# Patient Record
Sex: Female | Born: 1955 | Race: White | Hispanic: No
Health system: Southern US, Community
[De-identification: ages and names within clinical notes are randomized; demographics above are authoritative.]

## PROBLEM LIST (undated history)

## (undated) DIAGNOSIS — F419 Anxiety disorder, unspecified: Secondary | ICD-10-CM

## (undated) DIAGNOSIS — F329 Major depressive disorder, single episode, unspecified: Secondary | ICD-10-CM

## (undated) DIAGNOSIS — G8929 Other chronic pain: Secondary | ICD-10-CM

## (undated) DIAGNOSIS — E669 Obesity, unspecified: Secondary | ICD-10-CM

## (undated) DIAGNOSIS — G40909 Epilepsy, unspecified, not intractable, without status epilepticus: Secondary | ICD-10-CM

## (undated) DIAGNOSIS — I639 Cerebral infarction, unspecified: Secondary | ICD-10-CM

## (undated) DIAGNOSIS — K760 Fatty (change of) liver, not elsewhere classified: Secondary | ICD-10-CM

## (undated) DIAGNOSIS — N189 Chronic kidney disease, unspecified: Secondary | ICD-10-CM

## (undated) DIAGNOSIS — A0472 Enterocolitis due to Clostridium difficile, not specified as recurrent: Secondary | ICD-10-CM

## (undated) DIAGNOSIS — F32A Depression, unspecified: Secondary | ICD-10-CM

## (undated) DIAGNOSIS — J449 Chronic obstructive pulmonary disease, unspecified: Secondary | ICD-10-CM

## (undated) DIAGNOSIS — D126 Benign neoplasm of colon, unspecified: Secondary | ICD-10-CM

## (undated) DIAGNOSIS — E1165 Type 2 diabetes mellitus with hyperglycemia: Secondary | ICD-10-CM

---
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
## 2018-03-13 DIAGNOSIS — R531 Weakness: Secondary | ICD-10-CM | POA: Diagnosis not present

## 2018-03-13 DIAGNOSIS — N289 Disorder of kidney and ureter, unspecified: Secondary | ICD-10-CM | POA: Diagnosis not present

## 2018-03-13 DIAGNOSIS — R262 Difficulty in walking, not elsewhere classified: Secondary | ICD-10-CM | POA: Diagnosis not present

## 2018-03-13 DIAGNOSIS — R197 Diarrhea, unspecified: Secondary | ICD-10-CM | POA: Diagnosis not present

## 2018-03-13 DIAGNOSIS — Z9181 History of falling: Secondary | ICD-10-CM | POA: Diagnosis not present

## 2018-03-13 DIAGNOSIS — E860 Dehydration: Secondary | ICD-10-CM | POA: Diagnosis not present

## 2018-03-14 DIAGNOSIS — R262 Difficulty in walking, not elsewhere classified: Secondary | ICD-10-CM | POA: Diagnosis not present

## 2018-03-14 DIAGNOSIS — N289 Disorder of kidney and ureter, unspecified: Secondary | ICD-10-CM | POA: Diagnosis not present

## 2018-03-14 DIAGNOSIS — Z9181 History of falling: Secondary | ICD-10-CM | POA: Diagnosis not present

## 2018-03-14 DIAGNOSIS — R531 Weakness: Secondary | ICD-10-CM | POA: Diagnosis not present

## 2018-04-01 DIAGNOSIS — I10 Essential (primary) hypertension: Secondary | ICD-10-CM | POA: Diagnosis not present

## 2018-04-01 DIAGNOSIS — R0789 Other chest pain: Secondary | ICD-10-CM | POA: Diagnosis not present

## 2018-04-01 DIAGNOSIS — W19XXXA Unspecified fall, initial encounter: Secondary | ICD-10-CM | POA: Diagnosis not present

## 2018-04-01 DIAGNOSIS — N189 Chronic kidney disease, unspecified: Secondary | ICD-10-CM | POA: Diagnosis not present

## 2018-04-01 DIAGNOSIS — E1165 Type 2 diabetes mellitus with hyperglycemia: Secondary | ICD-10-CM | POA: Diagnosis not present

## 2018-04-01 DIAGNOSIS — E875 Hyperkalemia: Secondary | ICD-10-CM | POA: Diagnosis not present

## 2018-05-04 DIAGNOSIS — R4182 Altered mental status, unspecified: Secondary | ICD-10-CM

## 2018-05-04 DIAGNOSIS — I10 Essential (primary) hypertension: Secondary | ICD-10-CM

## 2018-05-04 DIAGNOSIS — N189 Chronic kidney disease, unspecified: Secondary | ICD-10-CM | POA: Diagnosis not present

## 2018-05-04 DIAGNOSIS — R531 Weakness: Secondary | ICD-10-CM

## 2018-05-04 DIAGNOSIS — R4781 Slurred speech: Secondary | ICD-10-CM | POA: Diagnosis not present

## 2018-05-04 DIAGNOSIS — E1165 Type 2 diabetes mellitus with hyperglycemia: Secondary | ICD-10-CM | POA: Diagnosis not present

## 2018-05-05 DIAGNOSIS — I517 Cardiomegaly: Secondary | ICD-10-CM

## 2018-05-05 DIAGNOSIS — N930 Postcoital and contact bleeding: Secondary | ICD-10-CM

## 2018-05-05 DIAGNOSIS — N189 Chronic kidney disease, unspecified: Secondary | ICD-10-CM | POA: Diagnosis not present

## 2018-05-05 DIAGNOSIS — E1165 Type 2 diabetes mellitus with hyperglycemia: Secondary | ICD-10-CM | POA: Diagnosis not present

## 2018-05-05 DIAGNOSIS — R4781 Slurred speech: Secondary | ICD-10-CM | POA: Diagnosis not present

## 2018-05-05 DIAGNOSIS — R4182 Altered mental status, unspecified: Secondary | ICD-10-CM | POA: Diagnosis not present

## 2018-05-06 DIAGNOSIS — R4182 Altered mental status, unspecified: Secondary | ICD-10-CM | POA: Diagnosis not present

## 2018-05-06 DIAGNOSIS — N189 Chronic kidney disease, unspecified: Secondary | ICD-10-CM | POA: Diagnosis not present

## 2018-05-06 DIAGNOSIS — R4781 Slurred speech: Secondary | ICD-10-CM | POA: Diagnosis not present

## 2018-05-06 DIAGNOSIS — E1165 Type 2 diabetes mellitus with hyperglycemia: Secondary | ICD-10-CM | POA: Diagnosis not present

## 2018-05-07 DIAGNOSIS — N189 Chronic kidney disease, unspecified: Secondary | ICD-10-CM | POA: Diagnosis not present

## 2018-05-07 DIAGNOSIS — E1165 Type 2 diabetes mellitus with hyperglycemia: Secondary | ICD-10-CM | POA: Diagnosis not present

## 2018-05-07 DIAGNOSIS — R4781 Slurred speech: Secondary | ICD-10-CM | POA: Diagnosis not present

## 2018-05-07 DIAGNOSIS — R4182 Altered mental status, unspecified: Secondary | ICD-10-CM | POA: Diagnosis not present

## 2018-05-08 DIAGNOSIS — R4781 Slurred speech: Secondary | ICD-10-CM | POA: Diagnosis not present

## 2018-05-08 DIAGNOSIS — N189 Chronic kidney disease, unspecified: Secondary | ICD-10-CM | POA: Diagnosis not present

## 2018-05-08 DIAGNOSIS — E1165 Type 2 diabetes mellitus with hyperglycemia: Secondary | ICD-10-CM | POA: Diagnosis not present

## 2018-05-08 DIAGNOSIS — R4182 Altered mental status, unspecified: Secondary | ICD-10-CM | POA: Diagnosis not present

## 2018-05-09 DIAGNOSIS — N189 Chronic kidney disease, unspecified: Secondary | ICD-10-CM | POA: Diagnosis not present

## 2018-05-09 DIAGNOSIS — E1165 Type 2 diabetes mellitus with hyperglycemia: Secondary | ICD-10-CM | POA: Diagnosis not present

## 2018-05-09 DIAGNOSIS — R4781 Slurred speech: Secondary | ICD-10-CM | POA: Diagnosis not present

## 2018-05-09 DIAGNOSIS — R4182 Altered mental status, unspecified: Secondary | ICD-10-CM | POA: Diagnosis not present

## 2018-05-10 DIAGNOSIS — R4182 Altered mental status, unspecified: Secondary | ICD-10-CM | POA: Diagnosis not present

## 2018-05-10 DIAGNOSIS — E1165 Type 2 diabetes mellitus with hyperglycemia: Secondary | ICD-10-CM | POA: Diagnosis not present

## 2018-05-10 DIAGNOSIS — R4781 Slurred speech: Secondary | ICD-10-CM | POA: Diagnosis not present

## 2018-05-10 DIAGNOSIS — N189 Chronic kidney disease, unspecified: Secondary | ICD-10-CM | POA: Diagnosis not present

## 2018-05-11 DIAGNOSIS — R4781 Slurred speech: Secondary | ICD-10-CM | POA: Diagnosis not present

## 2018-05-11 DIAGNOSIS — N179 Acute kidney failure, unspecified: Secondary | ICD-10-CM

## 2018-05-11 DIAGNOSIS — M21331 Wrist drop, right wrist: Secondary | ICD-10-CM

## 2018-05-11 DIAGNOSIS — R262 Difficulty in walking, not elsewhere classified: Secondary | ICD-10-CM

## 2018-05-11 DIAGNOSIS — E860 Dehydration: Secondary | ICD-10-CM

## 2018-05-11 DIAGNOSIS — N390 Urinary tract infection, site not specified: Secondary | ICD-10-CM

## 2018-05-11 DIAGNOSIS — E875 Hyperkalemia: Secondary | ICD-10-CM

## 2018-05-11 DIAGNOSIS — E1165 Type 2 diabetes mellitus with hyperglycemia: Secondary | ICD-10-CM | POA: Diagnosis not present

## 2018-05-11 DIAGNOSIS — R4182 Altered mental status, unspecified: Secondary | ICD-10-CM | POA: Diagnosis not present

## 2018-05-11 DIAGNOSIS — F0390 Unspecified dementia without behavioral disturbance: Secondary | ICD-10-CM

## 2018-05-11 DIAGNOSIS — G5630 Lesion of radial nerve, unspecified upper limb: Secondary | ICD-10-CM

## 2018-05-11 DIAGNOSIS — N189 Chronic kidney disease, unspecified: Secondary | ICD-10-CM | POA: Diagnosis not present

## 2018-05-11 DIAGNOSIS — S9032XA Contusion of left foot, initial encounter: Secondary | ICD-10-CM

## 2018-05-11 DIAGNOSIS — W19XXXA Unspecified fall, initial encounter: Secondary | ICD-10-CM

## 2018-05-11 DIAGNOSIS — R109 Unspecified abdominal pain: Secondary | ICD-10-CM

## 2018-05-12 DIAGNOSIS — N189 Chronic kidney disease, unspecified: Secondary | ICD-10-CM | POA: Diagnosis not present

## 2018-05-12 DIAGNOSIS — R4182 Altered mental status, unspecified: Secondary | ICD-10-CM | POA: Diagnosis not present

## 2018-05-12 DIAGNOSIS — R4781 Slurred speech: Secondary | ICD-10-CM | POA: Diagnosis not present

## 2018-05-12 DIAGNOSIS — E1165 Type 2 diabetes mellitus with hyperglycemia: Secondary | ICD-10-CM | POA: Diagnosis not present

## 2018-05-13 DIAGNOSIS — R4182 Altered mental status, unspecified: Secondary | ICD-10-CM | POA: Diagnosis not present

## 2018-05-13 DIAGNOSIS — N189 Chronic kidney disease, unspecified: Secondary | ICD-10-CM | POA: Diagnosis not present

## 2018-05-13 DIAGNOSIS — E1165 Type 2 diabetes mellitus with hyperglycemia: Secondary | ICD-10-CM | POA: Diagnosis not present

## 2018-05-13 DIAGNOSIS — R4781 Slurred speech: Secondary | ICD-10-CM | POA: Diagnosis not present

## 2018-05-14 DIAGNOSIS — R4182 Altered mental status, unspecified: Secondary | ICD-10-CM | POA: Diagnosis not present

## 2018-05-14 DIAGNOSIS — N189 Chronic kidney disease, unspecified: Secondary | ICD-10-CM | POA: Diagnosis not present

## 2018-05-14 DIAGNOSIS — E1165 Type 2 diabetes mellitus with hyperglycemia: Secondary | ICD-10-CM | POA: Diagnosis not present

## 2018-05-14 DIAGNOSIS — R4781 Slurred speech: Secondary | ICD-10-CM | POA: Diagnosis not present

## 2018-05-15 DIAGNOSIS — E1165 Type 2 diabetes mellitus with hyperglycemia: Secondary | ICD-10-CM | POA: Diagnosis not present

## 2018-05-15 DIAGNOSIS — R4182 Altered mental status, unspecified: Secondary | ICD-10-CM | POA: Diagnosis not present

## 2018-05-15 DIAGNOSIS — N189 Chronic kidney disease, unspecified: Secondary | ICD-10-CM | POA: Diagnosis not present

## 2018-05-15 DIAGNOSIS — R4781 Slurred speech: Secondary | ICD-10-CM | POA: Diagnosis not present

## 2018-10-13 DIAGNOSIS — I639 Cerebral infarction, unspecified: Secondary | ICD-10-CM

## 2018-10-13 DIAGNOSIS — R112 Nausea with vomiting, unspecified: Secondary | ICD-10-CM

## 2018-10-13 DIAGNOSIS — N189 Chronic kidney disease, unspecified: Secondary | ICD-10-CM

## 2018-10-13 DIAGNOSIS — Z765 Malingerer [conscious simulation]: Secondary | ICD-10-CM

## 2018-10-13 DIAGNOSIS — Z9119 Patient's noncompliance with other medical treatment and regimen: Secondary | ICD-10-CM

## 2018-10-13 DIAGNOSIS — K219 Gastro-esophageal reflux disease without esophagitis: Secondary | ICD-10-CM

## 2018-10-13 DIAGNOSIS — E1310 Other specified diabetes mellitus with ketoacidosis without coma: Secondary | ICD-10-CM

## 2018-10-13 DIAGNOSIS — I10 Essential (primary) hypertension: Secondary | ICD-10-CM

## 2018-10-13 DIAGNOSIS — E1165 Type 2 diabetes mellitus with hyperglycemia: Secondary | ICD-10-CM

## 2018-10-13 DIAGNOSIS — I160 Hypertensive urgency: Secondary | ICD-10-CM

## 2018-10-14 DIAGNOSIS — E785 Hyperlipidemia, unspecified: Secondary | ICD-10-CM

## 2018-10-14 DIAGNOSIS — I160 Hypertensive urgency: Secondary | ICD-10-CM | POA: Diagnosis not present

## 2018-10-14 DIAGNOSIS — K219 Gastro-esophageal reflux disease without esophagitis: Secondary | ICD-10-CM | POA: Diagnosis not present

## 2018-10-14 DIAGNOSIS — E1310 Other specified diabetes mellitus with ketoacidosis without coma: Secondary | ICD-10-CM | POA: Diagnosis not present

## 2018-10-14 DIAGNOSIS — N390 Urinary tract infection, site not specified: Secondary | ICD-10-CM

## 2018-10-15 DIAGNOSIS — N390 Urinary tract infection, site not specified: Secondary | ICD-10-CM | POA: Diagnosis not present

## 2018-10-15 DIAGNOSIS — E1310 Other specified diabetes mellitus with ketoacidosis without coma: Secondary | ICD-10-CM | POA: Diagnosis not present

## 2018-10-15 DIAGNOSIS — I160 Hypertensive urgency: Secondary | ICD-10-CM | POA: Diagnosis not present

## 2018-10-15 DIAGNOSIS — K219 Gastro-esophageal reflux disease without esophagitis: Secondary | ICD-10-CM | POA: Diagnosis not present

## 2018-10-16 DIAGNOSIS — E1310 Other specified diabetes mellitus with ketoacidosis without coma: Secondary | ICD-10-CM | POA: Diagnosis not present

## 2018-10-16 DIAGNOSIS — I160 Hypertensive urgency: Secondary | ICD-10-CM | POA: Diagnosis not present

## 2018-10-16 DIAGNOSIS — K219 Gastro-esophageal reflux disease without esophagitis: Secondary | ICD-10-CM | POA: Diagnosis not present

## 2018-10-16 DIAGNOSIS — N390 Urinary tract infection, site not specified: Secondary | ICD-10-CM | POA: Diagnosis not present

## 2018-11-14 DIAGNOSIS — I10 Essential (primary) hypertension: Secondary | ICD-10-CM

## 2018-11-14 DIAGNOSIS — E1165 Type 2 diabetes mellitus with hyperglycemia: Secondary | ICD-10-CM | POA: Diagnosis not present

## 2018-11-14 DIAGNOSIS — N179 Acute kidney failure, unspecified: Secondary | ICD-10-CM | POA: Diagnosis not present

## 2018-11-14 DIAGNOSIS — E860 Dehydration: Secondary | ICD-10-CM | POA: Diagnosis not present

## 2018-11-15 DIAGNOSIS — E860 Dehydration: Secondary | ICD-10-CM | POA: Diagnosis not present

## 2018-11-15 DIAGNOSIS — I10 Essential (primary) hypertension: Secondary | ICD-10-CM | POA: Diagnosis not present

## 2018-11-15 DIAGNOSIS — N179 Acute kidney failure, unspecified: Secondary | ICD-10-CM | POA: Diagnosis not present

## 2018-11-15 DIAGNOSIS — E1165 Type 2 diabetes mellitus with hyperglycemia: Secondary | ICD-10-CM | POA: Diagnosis not present

## 2018-12-03 HISTORY — PX: COLONOSCOPY WITH ESOPHAGOGASTRODUODENOSCOPY (EGD): SHX5779

## 2019-06-12 ENCOUNTER — Inpatient Hospital Stay (HOSPITAL_COMMUNITY)
Admission: EM | Admit: 2019-06-12 | Discharge: 2019-07-04 | DRG: 917 | Disposition: E | Payer: Medicaid Other | Source: Other Acute Inpatient Hospital | Attending: Pulmonary Disease | Admitting: Pulmonary Disease

## 2019-06-12 DIAGNOSIS — D689 Coagulation defect, unspecified: Secondary | ICD-10-CM | POA: Diagnosis present

## 2019-06-12 DIAGNOSIS — N170 Acute kidney failure with tubular necrosis: Secondary | ICD-10-CM | POA: Diagnosis not present

## 2019-06-12 DIAGNOSIS — J449 Chronic obstructive pulmonary disease, unspecified: Secondary | ICD-10-CM | POA: Diagnosis not present

## 2019-06-12 DIAGNOSIS — N179 Acute kidney failure, unspecified: Secondary | ICD-10-CM

## 2019-06-12 DIAGNOSIS — R579 Shock, unspecified: Secondary | ICD-10-CM | POA: Diagnosis not present

## 2019-06-12 DIAGNOSIS — T41295A Adverse effect of other general anesthetics, initial encounter: Secondary | ICD-10-CM | POA: Diagnosis present

## 2019-06-12 DIAGNOSIS — E872 Acidosis: Secondary | ICD-10-CM | POA: Diagnosis present

## 2019-06-12 DIAGNOSIS — K7200 Acute and subacute hepatic failure without coma: Secondary | ICD-10-CM | POA: Diagnosis present

## 2019-06-12 DIAGNOSIS — R6521 Severe sepsis with septic shock: Secondary | ICD-10-CM | POA: Diagnosis not present

## 2019-06-12 DIAGNOSIS — Z6841 Body Mass Index (BMI) 40.0 and over, adult: Secondary | ICD-10-CM | POA: Diagnosis not present

## 2019-06-12 DIAGNOSIS — J9601 Acute respiratory failure with hypoxia: Secondary | ICD-10-CM | POA: Diagnosis present

## 2019-06-12 DIAGNOSIS — K760 Fatty (change of) liver, not elsewhere classified: Secondary | ICD-10-CM | POA: Diagnosis not present

## 2019-06-12 DIAGNOSIS — J9811 Atelectasis: Secondary | ICD-10-CM | POA: Diagnosis not present

## 2019-06-12 DIAGNOSIS — Z66 Do not resuscitate: Secondary | ICD-10-CM | POA: Diagnosis not present

## 2019-06-12 DIAGNOSIS — E1101 Type 2 diabetes mellitus with hyperosmolarity with coma: Secondary | ICD-10-CM | POA: Diagnosis not present

## 2019-06-12 DIAGNOSIS — I129 Hypertensive chronic kidney disease with stage 1 through stage 4 chronic kidney disease, or unspecified chronic kidney disease: Secondary | ICD-10-CM | POA: Diagnosis present

## 2019-06-12 DIAGNOSIS — K559 Vascular disorder of intestine, unspecified: Secondary | ICD-10-CM | POA: Diagnosis present

## 2019-06-12 DIAGNOSIS — E11649 Type 2 diabetes mellitus with hypoglycemia without coma: Secondary | ICD-10-CM | POA: Diagnosis not present

## 2019-06-12 DIAGNOSIS — Z20828 Contact with and (suspected) exposure to other viral communicable diseases: Secondary | ICD-10-CM | POA: Diagnosis present

## 2019-06-12 DIAGNOSIS — E861 Hypovolemia: Secondary | ICD-10-CM | POA: Diagnosis present

## 2019-06-12 DIAGNOSIS — G9341 Metabolic encephalopathy: Secondary | ICD-10-CM | POA: Diagnosis not present

## 2019-06-12 DIAGNOSIS — I4891 Unspecified atrial fibrillation: Secondary | ICD-10-CM | POA: Diagnosis not present

## 2019-06-12 DIAGNOSIS — E785 Hyperlipidemia, unspecified: Secondary | ICD-10-CM | POA: Diagnosis present

## 2019-06-12 DIAGNOSIS — K210 Gastro-esophageal reflux disease with esophagitis: Secondary | ICD-10-CM | POA: Diagnosis present

## 2019-06-12 DIAGNOSIS — Z8673 Personal history of transient ischemic attack (TIA), and cerebral infarction without residual deficits: Secondary | ICD-10-CM

## 2019-06-12 DIAGNOSIS — G9340 Encephalopathy, unspecified: Secondary | ICD-10-CM | POA: Diagnosis not present

## 2019-06-12 DIAGNOSIS — Z794 Long term (current) use of insulin: Secondary | ICD-10-CM

## 2019-06-12 DIAGNOSIS — I952 Hypotension due to drugs: Secondary | ICD-10-CM | POA: Diagnosis present

## 2019-06-12 DIAGNOSIS — E1165 Type 2 diabetes mellitus with hyperglycemia: Secondary | ICD-10-CM | POA: Diagnosis present

## 2019-06-12 DIAGNOSIS — F419 Anxiety disorder, unspecified: Secondary | ICD-10-CM | POA: Diagnosis present

## 2019-06-12 DIAGNOSIS — N183 Chronic kidney disease, stage 3 (moderate): Secondary | ICD-10-CM | POA: Diagnosis present

## 2019-06-12 DIAGNOSIS — Z86010 Personal history of colonic polyps: Secondary | ICD-10-CM

## 2019-06-12 DIAGNOSIS — K7290 Hepatic failure, unspecified without coma: Secondary | ICD-10-CM | POA: Diagnosis not present

## 2019-06-12 DIAGNOSIS — D631 Anemia in chronic kidney disease: Secondary | ICD-10-CM | POA: Diagnosis present

## 2019-06-12 DIAGNOSIS — J9600 Acute respiratory failure, unspecified whether with hypoxia or hypercapnia: Secondary | ICD-10-CM

## 2019-06-12 DIAGNOSIS — T391X4S Poisoning by 4-Aminophenol derivatives, undetermined, sequela: Secondary | ICD-10-CM | POA: Diagnosis not present

## 2019-06-12 DIAGNOSIS — G92 Toxic encephalopathy: Secondary | ICD-10-CM | POA: Diagnosis not present

## 2019-06-12 DIAGNOSIS — Z9049 Acquired absence of other specified parts of digestive tract: Secondary | ICD-10-CM

## 2019-06-12 DIAGNOSIS — E781 Pure hyperglyceridemia: Secondary | ICD-10-CM | POA: Diagnosis not present

## 2019-06-12 DIAGNOSIS — R140 Abdominal distension (gaseous): Secondary | ICD-10-CM

## 2019-06-12 DIAGNOSIS — E1122 Type 2 diabetes mellitus with diabetic chronic kidney disease: Secondary | ICD-10-CM | POA: Diagnosis present

## 2019-06-12 DIAGNOSIS — Y92239 Unspecified place in hospital as the place of occurrence of the external cause: Secondary | ICD-10-CM | POA: Diagnosis not present

## 2019-06-12 DIAGNOSIS — G40909 Epilepsy, unspecified, not intractable, without status epilepticus: Secondary | ICD-10-CM | POA: Diagnosis not present

## 2019-06-12 DIAGNOSIS — T391X1A Poisoning by 4-Aminophenol derivatives, accidental (unintentional), initial encounter: Secondary | ICD-10-CM | POA: Diagnosis present

## 2019-06-12 DIAGNOSIS — R509 Fever, unspecified: Secondary | ICD-10-CM

## 2019-06-12 DIAGNOSIS — Z515 Encounter for palliative care: Secondary | ICD-10-CM | POA: Diagnosis not present

## 2019-06-12 DIAGNOSIS — Z978 Presence of other specified devices: Secondary | ICD-10-CM

## 2019-06-12 DIAGNOSIS — R680 Hypothermia, not associated with low environmental temperature: Secondary | ICD-10-CM | POA: Diagnosis not present

## 2019-06-12 DIAGNOSIS — G894 Chronic pain syndrome: Secondary | ICD-10-CM | POA: Diagnosis present

## 2019-06-12 DIAGNOSIS — E870 Hyperosmolality and hypernatremia: Secondary | ICD-10-CM | POA: Diagnosis not present

## 2019-06-12 DIAGNOSIS — K567 Ileus, unspecified: Secondary | ICD-10-CM | POA: Diagnosis not present

## 2019-06-12 DIAGNOSIS — Z79899 Other long term (current) drug therapy: Secondary | ICD-10-CM

## 2019-06-12 DIAGNOSIS — A419 Sepsis, unspecified organism: Secondary | ICD-10-CM | POA: Diagnosis not present

## 2019-06-12 DIAGNOSIS — Y92009 Unspecified place in unspecified non-institutional (private) residence as the place of occurrence of the external cause: Secondary | ICD-10-CM

## 2019-06-12 DIAGNOSIS — F329 Major depressive disorder, single episode, unspecified: Secondary | ICD-10-CM | POA: Diagnosis present

## 2019-06-12 DIAGNOSIS — Z792 Long term (current) use of antibiotics: Secondary | ICD-10-CM

## 2019-06-12 DIAGNOSIS — K7201 Acute and subacute hepatic failure with coma: Secondary | ICD-10-CM | POA: Diagnosis not present

## 2019-06-12 DIAGNOSIS — K047 Periapical abscess without sinus: Secondary | ICD-10-CM | POA: Diagnosis present

## 2019-06-12 DIAGNOSIS — Z452 Encounter for adjustment and management of vascular access device: Secondary | ICD-10-CM

## 2019-06-12 HISTORY — DX: Type 2 diabetes mellitus with hyperglycemia: E11.65

## 2019-06-12 HISTORY — DX: Fatty (change of) liver, not elsewhere classified: K76.0

## 2019-06-12 HISTORY — DX: Chronic kidney disease, unspecified: N18.9

## 2019-06-12 HISTORY — DX: Obesity, unspecified: E66.9

## 2019-06-12 HISTORY — DX: Chronic obstructive pulmonary disease, unspecified: J44.9

## 2019-06-12 HISTORY — DX: Enterocolitis due to Clostridium difficile, not specified as recurrent: A04.72

## 2019-06-12 HISTORY — DX: Benign neoplasm of colon, unspecified: D12.6

## 2019-06-12 HISTORY — DX: Other chronic pain: G89.29

## 2019-06-12 HISTORY — DX: Epilepsy, unspecified, not intractable, without status epilepticus: G40.909

## 2019-06-12 HISTORY — DX: Anxiety disorder, unspecified: F41.9

## 2019-06-12 HISTORY — DX: Depression, unspecified: F32.A

## 2019-06-12 HISTORY — DX: Cerebral infarction, unspecified: I63.9

## 2019-06-12 LAB — CBC
HCT: 37.3 % (ref 36.0–46.0)
Hemoglobin: 11.9 g/dL — ABNORMAL LOW (ref 12.0–15.0)
MCH: 25.4 pg — ABNORMAL LOW (ref 26.0–34.0)
MCHC: 31.9 g/dL (ref 30.0–36.0)
MCV: 79.7 fL — ABNORMAL LOW (ref 80.0–100.0)
Platelets: 292 10*3/uL (ref 150–400)
RBC: 4.68 MIL/uL (ref 3.87–5.11)
RDW: 15.2 % (ref 11.5–15.5)
WBC: 16.3 10*3/uL — ABNORMAL HIGH (ref 4.0–10.5)
nRBC: 0 % (ref 0.0–0.2)

## 2019-06-12 LAB — HEPATIC FUNCTION PANEL
ALT: 314 U/L — ABNORMAL HIGH (ref 0–44)
AST: 555 U/L — ABNORMAL HIGH (ref 15–41)
Albumin: 2.7 g/dL — ABNORMAL LOW (ref 3.5–5.0)
Alkaline Phosphatase: 119 U/L (ref 38–126)
Bilirubin, Direct: 0.2 mg/dL (ref 0.0–0.2)
Indirect Bilirubin: 0.7 mg/dL (ref 0.3–0.9)
Total Bilirubin: 0.9 mg/dL (ref 0.3–1.2)
Total Protein: 6 g/dL — ABNORMAL LOW (ref 6.5–8.1)

## 2019-06-12 LAB — APTT: aPTT: 26 seconds (ref 24–36)

## 2019-06-12 LAB — PHOSPHORUS: Phosphorus: 5.9 mg/dL — ABNORMAL HIGH (ref 2.5–4.6)

## 2019-06-12 LAB — POCT I-STAT 7, (LYTES, BLD GAS, ICA,H+H)
Acid-base deficit: 4 mmol/L — ABNORMAL HIGH (ref 0.0–2.0)
Bicarbonate: 19.6 mmol/L — ABNORMAL LOW (ref 20.0–28.0)
Calcium, Ion: 1.1 mmol/L — ABNORMAL LOW (ref 1.15–1.40)
HCT: 35 % — ABNORMAL LOW (ref 36.0–46.0)
Hemoglobin: 11.9 g/dL — ABNORMAL LOW (ref 12.0–15.0)
O2 Saturation: 99 %
Patient temperature: 36.8
Potassium: 2.9 mmol/L — ABNORMAL LOW (ref 3.5–5.1)
Sodium: 144 mmol/L (ref 135–145)
TCO2: 21 mmol/L — ABNORMAL LOW (ref 22–32)
pCO2 arterial: 31.9 mmHg — ABNORMAL LOW (ref 32.0–48.0)
pH, Arterial: 7.395 (ref 7.350–7.450)
pO2, Arterial: 159 mmHg — ABNORMAL HIGH (ref 83.0–108.0)

## 2019-06-12 LAB — BETA-HYDROXYBUTYRIC ACID: Beta-Hydroxybutyric Acid: 0.22 mmol/L (ref 0.05–0.27)

## 2019-06-12 LAB — PROTIME-INR
INR: 1.4 — ABNORMAL HIGH (ref 0.8–1.2)
Prothrombin Time: 16.7 seconds — ABNORMAL HIGH (ref 11.4–15.2)

## 2019-06-12 LAB — BASIC METABOLIC PANEL
Anion gap: 21 — ABNORMAL HIGH (ref 5–15)
BUN: 35 mg/dL — ABNORMAL HIGH (ref 8–23)
CO2: 21 mmol/L — ABNORMAL LOW (ref 22–32)
Calcium: 8.4 mg/dL — ABNORMAL LOW (ref 8.9–10.3)
Chloride: 102 mmol/L (ref 98–111)
Creatinine, Ser: 2.62 mg/dL — ABNORMAL HIGH (ref 0.44–1.00)
GFR calc Af Amer: 22 mL/min — ABNORMAL LOW (ref >60)
GFR calc non Af Amer: 19 mL/min — ABNORMAL LOW (ref >60)
Glucose, Bld: 212 mg/dL — ABNORMAL HIGH (ref 70–99)
Potassium: 3.1 mmol/L — ABNORMAL LOW (ref 3.5–5.1)
Sodium: 144 mmol/L (ref 135–145)

## 2019-06-12 LAB — MAGNESIUM: Magnesium: 2.4 mg/dL (ref 1.7–2.4)

## 2019-06-12 LAB — ACETAMINOPHEN LEVEL: Acetaminophen (Tylenol), Serum: 25 ug/mL (ref 10–30)

## 2019-06-12 LAB — GLUCOSE, CAPILLARY
Glucose-Capillary: 195 mg/dL — ABNORMAL HIGH (ref 70–99)
Glucose-Capillary: 224 mg/dL — ABNORMAL HIGH (ref 70–99)
Glucose-Capillary: 285 mg/dL — ABNORMAL HIGH (ref 70–99)

## 2019-06-12 LAB — LACTIC ACID, PLASMA: Lactic Acid, Venous: 4.8 mmol/L (ref 0.5–1.9)

## 2019-06-12 LAB — CK: Total CK: 66 U/L (ref 38–234)

## 2019-06-12 MED ORDER — SODIUM CHLORIDE 0.9% FLUSH
10.0000 mL | Freq: Two times a day (BID) | INTRAVENOUS | Status: DC
  Administered 2019-06-13 – 2019-06-16 (×7): 10 mL

## 2019-06-12 MED ORDER — DEXTROSE 5 % IV SOLN
15.0000 mg/kg/h | INTRAVENOUS | Status: DC
  Filled 2019-06-12 (×2): qty 210

## 2019-06-12 MED ORDER — CHLORHEXIDINE GLUCONATE CLOTH 2 % EX PADS: 6.0000 | MEDICATED_PAD | Freq: Every day | CUTANEOUS | Status: DC

## 2019-06-12 MED ORDER — SODIUM CHLORIDE 0.9 % IV BOLUS
500.0000 mL | Freq: Once | INTRAVENOUS | Status: AC
  Administered 2019-06-12: 500 mL via INTRAVENOUS

## 2019-06-12 MED ORDER — ALBUTEROL SULFATE (2.5 MG/3ML) 0.083% IN NEBU: 2.5000 mg | INHALATION_SOLUTION | RESPIRATORY_TRACT | Status: DC | PRN

## 2019-06-12 MED ORDER — PROPOFOL 1000 MG/100ML IV EMUL
0.0000 ug/kg/min | INTRAVENOUS | Status: DC
  Administered 2019-06-13: 25 ug/kg/min via INTRAVENOUS
  Administered 2019-06-13: 30 ug/kg/min via INTRAVENOUS
  Administered 2019-06-13: 25 ug/kg/min via INTRAVENOUS
  Administered 2019-06-13: 30 ug/kg/min via INTRAVENOUS
  Administered 2019-06-14: 35 ug/kg/min via INTRAVENOUS
  Filled 2019-06-12 (×2): qty 100
  Filled 2019-06-12: qty 200
  Filled 2019-06-12 (×2): qty 100

## 2019-06-12 MED ORDER — PANTOPRAZOLE SODIUM 40 MG IV SOLR
40.0000 mg | Freq: Every day | INTRAVENOUS | Status: DC
  Administered 2019-06-12 – 2019-06-15 (×4): 40 mg via INTRAVENOUS
  Filled 2019-06-12 (×4): qty 40

## 2019-06-12 MED ORDER — SODIUM CHLORIDE 0.9% FLUSH: 10.0000 mL | INTRAVENOUS | Status: DC | PRN

## 2019-06-12 MED ORDER — HEPARIN SODIUM (PORCINE) 5000 UNIT/ML IJ SOLN
5000.0000 [IU] | Freq: Three times a day (TID) | INTRAMUSCULAR | Status: DC
  Administered 2019-06-12 – 2019-06-17 (×13): 5000 [IU] via SUBCUTANEOUS
  Filled 2019-06-12 (×12): qty 1

## 2019-06-12 MED ORDER — ACETAMINOPHEN 325 MG PO TABS: 650.0000 mg | ORAL_TABLET | ORAL | Status: DC | PRN

## 2019-06-12 MED ORDER — ORAL CARE MOUTH RINSE
15.0000 mL | OROMUCOSAL | Status: DC
  Administered 2019-06-13 – 2019-06-17 (×44): 15 mL via OROMUCOSAL

## 2019-06-12 MED ORDER — LEVETIRACETAM IN NACL 500 MG/100ML IV SOLN
500.0000 mg | Freq: Two times a day (BID) | INTRAVENOUS | Status: DC
  Administered 2019-06-13 – 2019-06-16 (×9): 500 mg via INTRAVENOUS
  Filled 2019-06-12 (×12): qty 100

## 2019-06-12 MED ORDER — SENNOSIDES 8.8 MG/5ML PO SYRP
10.0000 mL | ORAL_SOLUTION | Freq: Two times a day (BID) | ORAL | Status: DC
  Administered 2019-06-12 – 2019-06-14 (×4): 10 mL
  Filled 2019-06-12 (×4): qty 10

## 2019-06-12 MED ORDER — INSULIN REGULAR(HUMAN) IN NACL 100-0.9 UT/100ML-% IV SOLN
INTRAVENOUS | Status: DC
  Administered 2019-06-12: 2.3 [IU]/h via INTRAVENOUS
  Administered 2019-06-13: 13:00:00 4.5 [IU]/h via INTRAVENOUS
  Administered 2019-06-14: 04:00:00 17 [IU]/h via INTRAVENOUS
  Administered 2019-06-14: 12:00:00 8.9 [IU]/h via INTRAVENOUS
  Administered 2019-06-15: 05:00:00 9.1 [IU]/h via INTRAVENOUS
  Administered 2019-06-16: 1.4 [IU]/h via INTRAVENOUS
  Filled 2019-06-12 (×8): qty 100

## 2019-06-12 MED ORDER — ONDANSETRON HCL 4 MG/2ML IJ SOLN: 4.0000 mg | Freq: Four times a day (QID) | INTRAMUSCULAR | Status: DC | PRN

## 2019-06-12 MED ORDER — POTASSIUM CHLORIDE 20 MEQ/15ML (10%) PO SOLN
40.0000 meq | Freq: Once | ORAL | Status: AC
  Administered 2019-06-12: 40 meq
  Filled 2019-06-12: qty 30

## 2019-06-12 MED ORDER — LEVETIRACETAM IN NACL 500 MG/100ML IV SOLN: 500.0000 mg | Freq: Two times a day (BID) | INTRAVENOUS | Status: DC

## 2019-06-12 MED ORDER — CHLORHEXIDINE GLUCONATE 0.12% ORAL RINSE (MEDLINE KIT)
15.0000 mL | Freq: Two times a day (BID) | OROMUCOSAL | Status: DC
  Administered 2019-06-12 – 2019-06-17 (×10): 15 mL via OROMUCOSAL

## 2019-06-12 MED ORDER — SODIUM CHLORIDE 0.9 % IV SOLN
INTRAVENOUS | Status: AC
  Administered 2019-06-12: 22:00:00 via INTRAVENOUS

## 2019-06-12 MED ORDER — FENTANYL CITRATE (PF) 100 MCG/2ML IJ SOLN
50.0000 ug | INTRAMUSCULAR | Status: DC | PRN
  Administered 2019-06-14: 20:00:00 50 ug via INTRAVENOUS

## 2019-06-12 MED ORDER — SODIUM CHLORIDE 0.9 % IV SOLN
INTRAVENOUS | Status: DC
  Administered 2019-06-12: 23:00:00 via INTRAVENOUS

## 2019-06-12 MED ORDER — BISACODYL 10 MG RE SUPP: 10.0000 mg | Freq: Every day | RECTAL | Status: DC | PRN

## 2019-06-12 MED ORDER — DEXTROSE-NACL 5-0.45 % IV SOLN
INTRAVENOUS | Status: DC
  Administered 2019-06-12 – 2019-06-14 (×3): via INTRAVENOUS

## 2019-06-12 MED ORDER — FENTANYL CITRATE (PF) 100 MCG/2ML IJ SOLN
50.0000 ug | INTRAMUSCULAR | Status: DC | PRN
  Administered 2019-06-13 – 2019-06-14 (×9): 100 ug via INTRAVENOUS
  Filled 2019-06-12 (×9): qty 2

## 2019-06-12 MED ORDER — CHLORHEXIDINE GLUCONATE CLOTH 2 % EX PADS
6.0000 | MEDICATED_PAD | Freq: Every day | CUTANEOUS | Status: DC
  Administered 2019-06-14 – 2019-06-17 (×4): 6 via TOPICAL

## 2019-06-12 NOTE — Progress Notes (Signed)
MEDICATION RELATED CONSULT NOTE - INITIAL   Pharmacy Consult for NAC Indication: tylenol OD  No Known Allergies  Patient Measurements: Height: 5\' 7"  (170.2 cm) Weight: 267 lb 3.2 oz (121.2 kg) IBW/kg (Calculated) : 61.6   Vital Signs: Temp: 97.9 F (36.6 C) (07/10 2245) BP: 126/79 (07/10 2245) Pulse Rate: 96 (07/10 2245) Intake/Output from previous day: No intake/output data recorded. Intake/Output from this shift: No intake/output data recorded.  Labs: Recent Labs    06/28/2019 2213 06/13/2019 2221 06/24/2019 2232  WBC 16.3*  --   --   HGB 11.9* 11.9*  --   HCT 37.3 35.0*  --   PLT 292  --   --   APTT  --   --  26  MG 2.4  --   --   PHOS 5.9*  --   --    CrCl cannot be calculated (No successful lab value found.).   Microbiology: No results found for this or any previous visit (from the past 720 hour(s)).  Medical History: No past medical history on file.  Medications:  Med history pending.  There were no AEDs on her outpatient prescription list  Assessment: 63 yo lady transferred from [facility] for APAP toxiciry and glucose >700.  NAC started there.  Goal of Therapy:  Prevention of liver damage  Plan:  Continue NAC.  F/u labs and poison control recommendations  Xylan Sheils Poteet 06/29/2019,11:31 PM

## 2019-06-12 NOTE — H&P (Signed)
NAME:  Melissa Foley, MRN:  [id number], DOB:  05/24/1956, LOS: 0 ADMISSION DATE:  07/02/2019, CONSULTATION DATE:  06/30/2019 REFERRING MD:  Oval Linsey ED, CHIEF COMPLAINT:  AMS  Brief History   63 yo F found unresponsive by neighbor, seizing in ED, found to have Acetaminophen toxicity and Glu > 700. Intubated and has R fem CVC  History of present illness   63 yo F PMH DM, HTN, COPD, CKD III, CVAs, Seizure Disorder, who presents to [facility] ED via EMS for AMS. Patient was found unresponsive on her couch by a neighbor who then dispatched EMS. Per EMS, patient was exhibiting respiratory distress, as well as rhythmic eye movement. In ED, patient continues to be unresponsive. Patient noted to have SpO2 70s. No change in mental status with Narcan. Nasal trumpet placed and rhythmic facial and eye contractions noted and was thought to be actively seizing. Ativan was administered without resolution. Patient then intubated. Patient noted to have glucose >900 without ketones in urine, found to have acetaminophen level 44, transaminitis, ProBNP >1000.  CT scan report states no acute intracranial abnormality.   Patient started on insulin gtt and NAC   Past Medical History  CKD III HTN HLD DM CVA Seizure Disorder COPD  Significant Hospital Events   7/10 intubated at [facility], [facility]VC placed, [facility]   Consults:   Procedures:  7/10 ETT>> 7/10 R Fem CVC>>   Significant Diagnostic Tests:  7/10 CT Head non-con Oval Linsey) read: no acute intracranial abnormality 7/10 CXR Oval Linsey) read: no cardiomegaly, mild bibasilar atelectasis, no pulmonary edema   Micro Data:  7/10 SARS CoV2> negative Oval Linsey performed)   Antimicrobials:   Interim history/subjective:  Transferred to [facility]. Arrives Intubated, Sedated, on insulin gtt and receiving NAC.   Objective   There were no vitals taken for this visit.       No intake or output data in the 24 hours ending 06/13/2019 2206 There were no  vitals filed for this visit.  Examination: General: critically ill appearing, obese adult F, sedated, intubated NAD HENT: NCAT. Pink mmm. ETT GT secure, Trachea midline. Anicteric sclera Lungs: Coarse breath sounds. Symmetrical chest expansion. No accessory muscle recruitment, breathing over vent  Cardiovascular: RRR distant heart sounds, no audible rgm. Capillary refill < 3 seconds BUE BLE  Abdomen: Obese, round, soft. + bowel sounds x4 Extremities: Symmetrical bulk and tone. No obvious joint deformity. Non-pitting BLE edema. Symmetrical adiposity below knees. No clubbing, no cyanosis BUE BLE.  Neuro: Sedated. Pinpoint pupils. Does not follow commands. Does not withdraw from pain  GU: foley collecting clear yellow urine Skin: Existing moist, red sacral pressure injury with slight breakdown. Otherwise pale, clean, dry.   Resolved Hospital Problem list     Assessment & Plan:   Acute respiratory failure with hypoxia requiring intubation -SpO2 70s prior to intubation at AP  -respiratory failure in setting of seizure disorder vs. APAP OD vs hyperglycemic coma -ProBNP elevated at [place], CXR read as bibasilar atelectasis  no pulmonary edema.   P Continue MV support AM CXR Pulm Hygiene Titrate PEEP/FiO2 for SpO2 > 92% PRN albuterol   Acetaminophen Toxicity -possible intentional OD, history is not clear at this time -NAC load started at [facility] per pharmacy.  Follow up with poison control -- in 22 hours is acetaminophen < 10, can stop NAC Trend Acetaminophen levels, coags and LFTs Will eventually need psych eval   Transaminitis -possibly related to APAP toxicity, unclear history and timing P Trend LFTs  Hyperglycemia with comatose features -Suspect HONK  P Check beta hydroxybuteric acid Insulin gtt for now Transition off insulin gtt per protocol  Continue IVF  BMP q4 Check mag and phos now  Replace K+, mag, phos as needed  Seizure Disorder  -Per Oval Linsey  chart, actively seizing on presentation. Report of features is variable.  -Lactic Acid 2.4 at [place] load at [place] P BID Keppra Trend lactic acid  EEG Consider neuro cs  Med rec to determine home meds (if any), plan to restart home AEDs if patient has home reg   Acute on CKD III P -Strict I/O -continue IVF -Checking electrolytes as above   Possible CHF -proBNP 1720 at [facility] -Troponin 0.08 at [place]- likely demand related  P ECHO  Hypotension -possibly related to propofol vs intravascular hypovolemia in setting of hyperglycemia, acute liver injury P 543ml NS bolus now Consider dc propofol if continued hypotension  Leukocytosis -likely reactive P Trend CBC and temperature    Best practice:  Diet: NPO Pain/Anxiety/Delirium protocol (if indicated): propofol, PRN fent VAP protocol (if indicated): Yes DVT prophylaxis: SQ heparin GI prophylaxis: protonix  Glucose control: insulin gtt Mobility: BR Code Status: Full  Family Communication: pending Disposition: Admit to ICU   Labs   CBC: No results for input(s): WBC, NEUTROABS, HGB, HCT, MCV, PLT in the last 168 hours.  Basic Metabolic Panel: No results for input(s): NA, K, CL, CO2, GLUCOSE, BUN, CREATININE, CALCIUM, MG, PHOS in the last 168 hours. GFR: CrCl cannot be calculated (No successful lab value found.). No results for input(s): PROCALCITON, WBC, LATICACIDVEN in the last 168 hours.  Liver Function Tests: No results for input(s): AST, ALT, ALKPHOS, BILITOT, PROT, ALBUMIN in the last 168 hours. No results for input(s): LIPASE, AMYLASE in the last 168 hours. No results for input(s): AMMONIA in the last 168 hours.  ABG No results found for: PHART, PCO2ART, PO2ART, HCO3, TCO2, ACIDBASEDEF, O2SAT   Coagulation Profile: No results for input(s): INR, PROTIME in the last 168 hours.  Cardiac Enzymes: No results for input(s): CKTOTAL, CKMB, CKMBINDEX, TROPONINI in the last 168 hours.  HbA1C:  No results found for: HGBA1C  CBG: Recent Labs  Lab 06/23/2019 2152  GLUCAP 285*    Review of Systems:   Unable to obtain, patient encephalopathic, intubated, sedated.   Past Medical History  She,  has no past medical history on file.   Surgical History     Social History      Family History   Her family history is not on file.   Allergies Not on File   Home Medications  Prior to Admission medications   Not on File     Critical care time: 65 minutes     Eliseo Gum MSN, AGACNP-BC [place] [phone] If no answer, [phone] 06/16/2019, 10:06 PM

## 2019-06-13 ENCOUNTER — Inpatient Hospital Stay (HOSPITAL_COMMUNITY): Payer: Medicaid Other

## 2019-06-13 DIAGNOSIS — J9601 Acute respiratory failure with hypoxia: Secondary | ICD-10-CM

## 2019-06-13 DIAGNOSIS — G9340 Encephalopathy, unspecified: Secondary | ICD-10-CM

## 2019-06-13 DIAGNOSIS — G9341 Metabolic encephalopathy: Secondary | ICD-10-CM

## 2019-06-13 LAB — BASIC METABOLIC PANEL
Anion gap: 18 — ABNORMAL HIGH (ref 5–15)
Anion gap: 18 — ABNORMAL HIGH (ref 5–15)
Anion gap: 19 — ABNORMAL HIGH (ref 5–15)
Anion gap: 19 — ABNORMAL HIGH (ref 5–15)
Anion gap: 18 — ABNORMAL HIGH (ref 5–15)
BUN: 37 mg/dL — ABNORMAL HIGH (ref 8–23)
BUN: 35 mg/dL — ABNORMAL HIGH (ref 8–23)
BUN: 36 mg/dL — ABNORMAL HIGH (ref 8–23)
BUN: 39 mg/dL — ABNORMAL HIGH (ref 8–23)
BUN: 38 mg/dL — ABNORMAL HIGH (ref 8–23)
CO2: 22 mmol/L (ref 22–32)
CO2: 22 mmol/L (ref 22–32)
CO2: 21 mmol/L — ABNORMAL LOW (ref 22–32)
CO2: 20 mmol/L — ABNORMAL LOW (ref 22–32)
CO2: 19 mmol/L — ABNORMAL LOW (ref 22–32)
Calcium: 8.4 mg/dL — ABNORMAL LOW (ref 8.9–10.3)
Calcium: 7.9 mg/dL — ABNORMAL LOW (ref 8.9–10.3)
Calcium: 8.2 mg/dL — ABNORMAL LOW (ref 8.9–10.3)
Calcium: 8.3 mg/dL — ABNORMAL LOW (ref 8.9–10.3)
Calcium: 8.2 mg/dL — ABNORMAL LOW (ref 8.9–10.3)
Chloride: 103 mmol/L (ref 98–111)
Chloride: 103 mmol/L (ref 98–111)
Chloride: 103 mmol/L (ref 98–111)
Chloride: 104 mmol/L (ref 98–111)
Chloride: 104 mmol/L (ref 98–111)
Creatinine, Ser: 2.55 mg/dL — ABNORMAL HIGH (ref 0.44–1.00)
Creatinine, Ser: 2.5 mg/dL — ABNORMAL HIGH (ref 0.44–1.00)
Creatinine, Ser: 2.41 mg/dL — ABNORMAL HIGH (ref 0.44–1.00)
Creatinine, Ser: 2.83 mg/dL — ABNORMAL HIGH (ref 0.44–1.00)
Creatinine, Ser: 2.93 mg/dL — ABNORMAL HIGH (ref 0.44–1.00)
GFR calc Af Amer: 23 mL/min — ABNORMAL LOW (ref >60)
GFR calc Af Amer: 23 mL/min — ABNORMAL LOW (ref >60)
GFR calc Af Amer: 24 mL/min — ABNORMAL LOW (ref >60)
GFR calc Af Amer: 20 mL/min — ABNORMAL LOW (ref >60)
GFR calc Af Amer: 19 mL/min — ABNORMAL LOW (ref >60)
GFR calc non Af Amer: 20 mL/min — ABNORMAL LOW (ref >60)
GFR calc non Af Amer: 20 mL/min — ABNORMAL LOW (ref >60)
GFR calc non Af Amer: 21 mL/min — ABNORMAL LOW (ref >60)
GFR calc non Af Amer: 17 mL/min — ABNORMAL LOW (ref >60)
GFR calc non Af Amer: 17 mL/min — ABNORMAL LOW (ref >60)
Glucose, Bld: 196 mg/dL — ABNORMAL HIGH (ref 70–99)
Glucose, Bld: 241 mg/dL — ABNORMAL HIGH (ref 70–99)
Glucose, Bld: 160 mg/dL — ABNORMAL HIGH (ref 70–99)
Glucose, Bld: 171 mg/dL — ABNORMAL HIGH (ref 70–99)
Glucose, Bld: 228 mg/dL — ABNORMAL HIGH (ref 70–99)
Potassium: 3.5 mmol/L (ref 3.5–5.1)
Potassium: 3 mmol/L — ABNORMAL LOW (ref 3.5–5.1)
Potassium: 3.3 mmol/L — ABNORMAL LOW (ref 3.5–5.1)
Potassium: 3.4 mmol/L — ABNORMAL LOW (ref 3.5–5.1)
Potassium: 3.4 mmol/L — ABNORMAL LOW (ref 3.5–5.1)
Sodium: 143 mmol/L (ref 135–145)
Sodium: 143 mmol/L (ref 135–145)
Sodium: 143 mmol/L (ref 135–145)
Sodium: 143 mmol/L (ref 135–145)
Sodium: 141 mmol/L (ref 135–145)

## 2019-06-13 LAB — MAGNESIUM
Magnesium: 2.2 mg/dL (ref 1.7–2.4)
Magnesium: 2.1 mg/dL (ref 1.7–2.4)
Magnesium: 2.1 mg/dL (ref 1.7–2.4)

## 2019-06-13 LAB — PROTIME-INR
INR: 1.5 — ABNORMAL HIGH (ref 0.8–1.2)
INR: 1.4 — ABNORMAL HIGH (ref 0.8–1.2)
Prothrombin Time: 17.8 seconds — ABNORMAL HIGH (ref 11.4–15.2)
Prothrombin Time: 17.2 seconds — ABNORMAL HIGH (ref 11.4–15.2)

## 2019-06-13 LAB — CBC
HCT: 36.5 % (ref 36.0–46.0)
Hemoglobin: 11.8 g/dL — ABNORMAL LOW (ref 12.0–15.0)
MCH: 26 pg (ref 26.0–34.0)
MCHC: 32.3 g/dL (ref 30.0–36.0)
MCV: 80.4 fL (ref 80.0–100.0)
Platelets: 263 10*3/uL (ref 150–400)
RBC: 4.54 MIL/uL (ref 3.87–5.11)
RDW: 15.2 % (ref 11.5–15.5)
WBC: 16.4 10*3/uL — ABNORMAL HIGH (ref 4.0–10.5)
nRBC: 0 % (ref 0.0–0.2)

## 2019-06-13 LAB — HEPATIC FUNCTION PANEL
ALT: 358 U/L — ABNORMAL HIGH (ref 0–44)
AST: 599 U/L — ABNORMAL HIGH (ref 15–41)
Albumin: 2.6 g/dL — ABNORMAL LOW (ref 3.5–5.0)
Alkaline Phosphatase: 116 U/L (ref 38–126)
Bilirubin, Direct: 0.1 mg/dL (ref 0.0–0.2)
Indirect Bilirubin: 0.7 mg/dL (ref 0.3–0.9)
Total Bilirubin: 0.8 mg/dL (ref 0.3–1.2)
Total Protein: 5.6 g/dL — ABNORMAL LOW (ref 6.5–8.1)

## 2019-06-13 LAB — MRSA PCR SCREENING: MRSA by PCR: NEGATIVE

## 2019-06-13 LAB — GLUCOSE, CAPILLARY
Glucose-Capillary: 130 mg/dL — ABNORMAL HIGH (ref 70–99)
Glucose-Capillary: 134 mg/dL — ABNORMAL HIGH (ref 70–99)
Glucose-Capillary: 135 mg/dL — ABNORMAL HIGH (ref 70–99)
Glucose-Capillary: 143 mg/dL — ABNORMAL HIGH (ref 70–99)
Glucose-Capillary: 144 mg/dL — ABNORMAL HIGH (ref 70–99)
Glucose-Capillary: 149 mg/dL — ABNORMAL HIGH (ref 70–99)
Glucose-Capillary: 153 mg/dL — ABNORMAL HIGH (ref 70–99)
Glucose-Capillary: 154 mg/dL — ABNORMAL HIGH (ref 70–99)
Glucose-Capillary: 154 mg/dL — ABNORMAL HIGH (ref 70–99)
Glucose-Capillary: 162 mg/dL — ABNORMAL HIGH (ref 70–99)
Glucose-Capillary: 164 mg/dL — ABNORMAL HIGH (ref 70–99)
Glucose-Capillary: 166 mg/dL — ABNORMAL HIGH (ref 70–99)
Glucose-Capillary: 168 mg/dL — ABNORMAL HIGH (ref 70–99)
Glucose-Capillary: 188 mg/dL — ABNORMAL HIGH (ref 70–99)
Glucose-Capillary: 205 mg/dL — ABNORMAL HIGH (ref 70–99)
Glucose-Capillary: 209 mg/dL — ABNORMAL HIGH (ref 70–99)
Glucose-Capillary: 209 mg/dL — ABNORMAL HIGH (ref 70–99)
Glucose-Capillary: 217 mg/dL — ABNORMAL HIGH (ref 70–99)
Glucose-Capillary: 221 mg/dL — ABNORMAL HIGH (ref 70–99)
Glucose-Capillary: 225 mg/dL — ABNORMAL HIGH (ref 70–99)
Glucose-Capillary: 231 mg/dL — ABNORMAL HIGH (ref 70–99)

## 2019-06-13 LAB — APTT
aPTT: 29 seconds (ref 24–36)
aPTT: 28 seconds (ref 24–36)

## 2019-06-13 LAB — ECHOCARDIOGRAM COMPLETE
Height: 67 in
Weight: 4402.15 oz

## 2019-06-13 LAB — LACTIC ACID, PLASMA: Lactic Acid, Venous: 1.6 mmol/L (ref 0.5–1.9)

## 2019-06-13 LAB — ACETAMINOPHEN LEVEL
Acetaminophen (Tylenol), Serum: 15 ug/mL (ref 10–30)
Acetaminophen (Tylenol), Serum: 12 ug/mL (ref 10–30)
Acetaminophen (Tylenol), Serum: <10 ug/mL — ABNORMAL LOW (ref 10–30)

## 2019-06-13 LAB — PHOSPHORUS
Phosphorus: 4.3 mg/dL (ref 2.5–4.6)
Phosphorus: 3.1 mg/dL (ref 2.5–4.6)
Phosphorus: 3.3 mg/dL (ref 2.5–4.6)

## 2019-06-13 LAB — TRIGLYCERIDES: Triglycerides: 205 mg/dL — ABNORMAL HIGH (ref <150)

## 2019-06-13 LAB — HIV ANTIBODY (ROUTINE TESTING W REFLEX): HIV Screen 4th Generation wRfx: NONREACTIVE

## 2019-06-13 MED ORDER — DEXTROSE 5 % IV SOLN
15.0000 mg/kg/h | INTRAVENOUS | Status: DC
  Administered 2019-06-13 – 2019-06-17 (×5): 15 mg/kg/h via INTRAVENOUS
  Filled 2019-06-13 (×8): qty 210

## 2019-06-13 MED ORDER — POTASSIUM CHLORIDE 10 MEQ/50ML IV SOLN
10.0000 meq | INTRAVENOUS | Status: AC
  Administered 2019-06-13 (×3): 10 meq via INTRAVENOUS
  Filled 2019-06-13: qty 50

## 2019-06-13 MED ORDER — PRO-STAT SUGAR FREE PO LIQD
30.0000 mL | Freq: Two times a day (BID) | ORAL | Status: DC
  Administered 2019-06-13 – 2019-06-16 (×7): 30 mL
  Filled 2019-06-13 (×7): qty 30

## 2019-06-13 MED ORDER — VITAL HIGH PROTEIN PO LIQD
1000.0000 mL | ORAL | Status: DC
  Administered 2019-06-13 – 2019-06-15 (×3): 1000 mL

## 2019-06-13 NOTE — Procedures (Signed)
History: 63 year old female with encephalopathy in the setting of severely high glucose  Sedation: Propofol  Technique: This is a 21 channel routine scalp EEG performed at the bedside with bipolar and monopolar montages arranged in accordance to the international 10/20 system of electrode placement. One channel was dedicated to EKG recording.    Background: The background consists of generalized irregular fairly disorganized high-voltage delta as well as some anteriorly predominant beta/alpha range activity.  There are brief intermittent periods of suppression at times lasting less than a second..  There is no evolving activity or activity that appears epileptiform.  Photic stimulation: Physiologic driving is not performed  EEG Abnormalities: 1) generalized irregular slow activity  Clinical Interpretation: This EEG is consistent with a generalized nonspecific cerebral dysfunction (encephalopathy).  Though nonspecific, this can be seen with sedating medication among other causes.    There was no seizure or seizure predisposition recorded on this study. Please note that lack of epileptiform activity on EEG does not preclude the possibility of epilepsy.    Tachycardia noted on EKG.  Roland Rack, MD [facility] [phone]  If 7pm- 7am, please page neurology on call as listed in [place].

## 2019-06-13 NOTE — Progress Notes (Signed)
 Initial Nutrition Assessment  RD working remotely.  DOCUMENTATION CODES:   Morbid obesity  INTERVENTION:   Tube Feeding:  Vital High Protein at 50 ml/hr Pro-Stat 30 mL BID Provides 1400 kcals, 136 g of protein and 1008 mL of free water Meets 100% estimated protein and calorie needs  TF regimen and propofol at current rate providing 1600 total kcal/day (100 % of kcal needs)  NUTRITION DIAGNOSIS:   Inadequate oral intake related to acute illness as evidenced by NPO status.  GOAL:   Provide needs based on ASPEN/SCCM guidelines  MONITOR:   Vent status, Skin, Labs, Weight trends  REASON FOR ASSESSMENT:   Ventilator    ASSESSMENT:   63 yo female admitted with acute metabolic encephalopathy with presume tylenol OD, acute respiratory failure requiring intubation, HONK, AKI on CKD. PMH includes DM, HTN, COPD, CKD III, CVA, seizure disorder   Unable to obtain diet and weight history at this time  Patient is currently intubated on ventilator support MV: 10.1 L/min Temp (24hrs), Avg:98.1 F (36.7 C), Min:97.2 F (36.2 C), Max:99.3 F (37.4 C)  Propofol: 11.4 ml/hr  Current wt 124.8 Kg; admission wt 121.2 kg. Net +1.9 L per I/O flow sheet, 1+ mild generalized edema.   Abd xray with OG tube with tip in stomach  Labs: potassium 3.0 (L), Creatinine 2.50, BUN 35 Meds: NS at 75 ml/hr/D5-1/2 NS at 75 ml/hr, insulin drip  NUTRITION - FOCUSED PHYSICAL EXAM:  Unable to assess  Diet Order:   Diet Order            Diet NPO time specified  Diet effective now              EDUCATION NEEDS:   Not appropriate for education at this time  Skin:  Skin Assessment: Skin Integrity Issues: Skin Integrity Issues:: Other (Comment) Other: MASD: sacrum and groin  Last BM:  no documented BM  Height:   Ht Readings from Last 1 Encounters:  06/21/2019 5\' 7"  (1.702 m)    Weight:   Wt Readings from Last 1 Encounters:  06/13/19 124.8 kg    Ideal Body Weight:  61.4  kg  BMI:  Body mass index is 43.09 kg/m.  Estimated Nutritional Needs:   Kcal:  5784-6962 kcals  Protein:  123-154 g  Fluid:  >/= 1.8 L    Torrey Ballinas MS, RDN, LDN, CNSC [phone] Pager  [phone] Weekend/On-Call Pager

## 2019-06-13 NOTE — Progress Notes (Signed)
Pharmacy note: IV acetylcysteine  63 yo female with tylenol overdose. -I was contacted RN who stated that poison control requested that  IV acetylcysteine continue due to elevated LFTs  Plan -Continue  IV acetylcysteine -Follow up LFTs  -Follow up with poison control in am  Hildred Laser, PharmD Clinical Pharmacist **Pharmacist phone directory can now be found on [email or web address] ([email or web address]).  Listed under [place].

## 2019-06-13 NOTE — Progress Notes (Signed)
Vent changed out to new vent d/t previous vent touch screen becoming unresponsive to touch.  No adverse affects to pt noted, no distress noted.  This appeared to be just a touch screen issue.  RT supervisor and RN aware.

## 2019-06-13 NOTE — Progress Notes (Signed)
This IVT RN was consulted for IO removal. IO to Left Shoulder in place. IO removed without difficulty. Dressing with vaseline gauze applied. Area dry clean and intact, no bleeding noted.

## 2019-06-13 NOTE — Progress Notes (Signed)
NAME:  Melissa Foley, MRN:  [id number], DOB:  08-14-1956, LOS: 1 ADMISSION DATE:  06/28/2019, CONSULTATION DATE:  06/07/2019 REFERRING MD:  O[facility] Linsey ED, CHIEF COMPLAINT:  AMS  Brief History   63 yo F found unresponsive by neighbor, seizing in ED, found to have Acetaminophen toxicity and Glu > 700. Intubated and has R fem CVC  History of present illness   63 yo F PMH DM, HTN, COPD, CKD III, CVAs, Seizure Disorder, who presents to [facility] ED via EMS for AMS. Patient was found unresponsive on her couch by a neighbor who then dispatched EMS. Per EMS, patient was exhibiting respiratory distress, as well as rhythmic eye movement. In ED, patient continues to be unresponsive. Patient noted to have SpO2 70s. No change in mental status with Narcan. Nasal trumpet placed and rhythmic facial and eye contractions noted and was thought to be actively seizing. Ativan was administered without resolution. Patient then intubated. Patient noted to have glucose >900 without ketones in urine, found to have acetaminophen level 44, transaminitis, ProBNP >1000.  CT scan report states no acute intracranial abnormality.   Patient started on insulin gtt and NAC   Past Medical History  CKD III HTN HLD DM CVA Seizure Disorder COPD  Significant Hospital Events   7/10 intub[facility] at [facility], [facility]VC placed, [facility]  7/11 No overnight events No seizures noted overnight  Consults:   Procedures:  7/10 ETT>> 7/10 R Fem CVC>>   Significant Diagnostic Tests:  7/10 CT Head non-con Oval Linsey) read: no acute intracranial abnormality 7/10 CXR Oval Linsey) read: no cardiomegaly, mild bibasilar atelectasis, no pulmonary edema   Micro Data:  7/10 SARS CoV2> negative Oval Linsey performed)   Antimicrobials:   Interim history/subjective:  On NAC, insulin gtt. on vent  Objective   Blood pressure (!) 141/108, pulse 100, temperature 99.3 F (37.4 C), resp. rate (!) 29, height 5\' 7"  (1.702 m), weight 124.8 kg, SpO2  100 %.    Vent Mode: PSV;CPAP FiO2 (%):  [40 %-60 %] 40 % Set Rate:  [16 bmp] 16 bmp Vt Set:  [450 mL] 450 mL PEEP:  [5 cmH20] 5 cmH20 Pressure Support:  [5 cmH20] 5 cmH20 Plateau Pressure:  [16 cmH20-17 cmH20] 16 cmH20   Intake/Output Summary (Last 24 hours) at 06/13/2019 0836 Last data filed at 06/13/2019 0865 Gross per 24 hour  Intake 2193.1 ml  Output 310 ml  Net 1883.1 ml   Filed Weights   06/19/2019 2227 06/13/19 0500  Weight: 121.2 kg 124.8 kg   Examination: Critically ill, sedated Moist oral mucosa Trachea midline S1-S2 appreciated Coarse breath sounds, reduced at the bases Abdomen is soft, bowel sounds appreciated Sedated  Chest x-ray with vascular congestion, no acute infiltrate, cardiomegaly Reviewed by myself  Resolved Hospital Problem list     Assessment & Plan:   Acute respiratory failure -Seizure disorder -Possibly acetaminophen overdose -Continue mechanical ventilator support -Adjust ventilator as needed  Acetaminophen toxicity -Unclear whether this was an intentional overdose -Continue NAC-acetaminophen level of 12 -Per poison control NAC may be stopped once acetaminophen level is less than 10 -Continue to trend coags and LFTs-still elevated  Transaminitis -Likely related to acetaminophen overdose -Continue to trend LFTs  Hyperglycemia with comatose features -We will transition off of insulin gtt. -Continue IV fluid -BMP every 4 -Replace electrolytes  Acute on chronic kidney disease -Continue IV fluid -Trend electrolytes -Monitor I/O  Seizure disorder -Twice daily Keppra -Follow EEG - Seizure Disorder  -Per Oval Linsey chart, actively seizing on presentation. Report of  features is variable.  -Lactic Acid 2.4 at [place] load at [employer] -Continue aggressive management of electrolyte derangement -We will consider neurology consult if not stabilizing  Possible congestive heart failure -Elevated BNP -Follow echocardiogram   Hypotension -Improving  Hyperglycemia -Keep on insulin gtt. at present -Anion gap remains elevated at 18  -Increasing insulin requirement  Monitor leukocytosis  Best practice:  Diet: Start tube feeds per protocol Pain/Anxiety/Delirium protocol (if indicated): propofol, PRN fent VAP protocol (if indicated): Yes DVT prophylaxis: SQ heparin GI prophylaxis: Protonix Glucose control: insulin gtt Mobility: BR Code Status: Full  Family Communication: Pending Disposition: Admit to ICU   Labs   CBC: Recent Labs  Lab 06/27/2019 2213 06/09/2019 2221 06/13/19 0449  WBC 16.3*  --  16.4*  HGB 11.9* 11.9* 11.8*  HCT 37.3 35.0* 36.5  MCV 79.7*  --  80.4  PLT 292  --  203    Basic Metabolic Panel: Recent Labs  Lab 06/11/2019 2213 06/16/2019 2221 06/13/19 0242 06/13/19 0449  NA 144 144 143 143  K 3.1* 2.9* 3.5 3.0*  CL 102  --  103 103  CO2 21*  --  22 22  GLUCOSE 212*  --  196* 241*  BUN 35*  --  37* 35*  CREATININE 2.62*  --  2.55* 2.50*  CALCIUM 8.4*  --  8.4* 7.9*  MG 2.4  --   --  2.2  PHOS 5.9*  --   --  4.3   GFR: Estimated Creatinine Clearance: 32.4 mL/min (A) (by C-G formula based on SCr of 2.5 mg/dL (H)). Recent Labs  Lab 07/01/2019 2213 06/13/19 0259 06/13/19 0449  WBC 16.3*  --  16.4*  LATICACIDVEN 4.8* 1.6  --     Liver Function Tests: Recent Labs  Lab 06/29/2019 2213 06/13/19 0449  AST 555* 599*  ALT 314* 358*  ALKPHOS 119 116  BILITOT 0.9 0.8  PROT 6.0* 5.6*  ALBUMIN 2.7* 2.6*   No results for input(s): LIPASE, AMYLASE in the last 168 hours. No results for input(s): AMMONIA in the last 168 hours.  ABG    Component Value Date/Time   PHART 7.395 07/02/2019 2221   PCO2ART 31.9 (L) 06/28/2019 2221   PO2ART 159.0 (H) 06/11/2019 2221   HCO3 19.6 (L) 06/05/2019 2221   TCO2 21 (L) 07/03/2019 2221   ACIDBASEDEF 4.0 (H) 06/09/2019 2221   O2SAT 99.0 06/09/2019 2221     Coagulation Profile: Recent Labs  Lab 06/27/2019 2232 06/13/19 0242 06/13/19  0449  INR 1.4* 1.5* 1.4*    Cardiac Enzymes: Recent Labs  Lab 06/18/2019 2213  CKTOTAL 66    HbA1C: No results found for: HGBA1C  CBG: Recent Labs  Lab 06/13/19 0336 06/13/19 0507 06/13/19 0612 06/13/19 0708 06/13/19 0801  GLUCAP 209* 149* 130* 144* 153*    Review of Systems:   Unable to obtain, patient encephalopathic, intubated, sedated.   Past Medical History  She,  has no past medical history on file.   Surgical History     Social History      Family History   Her family history is not on file.   Allergies No Known Allergies   Home Medications  Prior to Admission medications   Not on File    The patient is critically ill with multiple organ system failure and requires high complexity decision making for assessment and support, frequent evaluation and titration of therapies, advanced monitoring, review of radiographic studies and interpretation of complex data.    Critical Care  Time devoted to patient care services, exclusive of separately billable procedures, described in this note is 35 minutes.  Sherrilyn Rist, MD [place], PCCM Cell: [phone]

## 2019-06-13 NOTE — Progress Notes (Signed)
Patient en route being transferred to 65M by assisting RRT. Report given to MICU RRT.

## 2019-06-13 NOTE — Progress Notes (Signed)
EEG complete - results pending 

## 2019-06-13 NOTE — Progress Notes (Signed)
Echocardiogram 2D Echocardiogram has been performed.  Oneal Deputy Tavi Gaughran 06/13/2019, 9:21 AM

## 2019-06-13 NOTE — Progress Notes (Signed)
RN called d/t pt w/ increased RR and HR.  RN reports giving sedation, and pt was placed back on full vent support.  HR 137 now, sat 96%.  No distress currently noted.

## 2019-06-13 NOTE — Progress Notes (Signed)
Patient transferred to 36M without complications

## 2019-06-14 ENCOUNTER — Inpatient Hospital Stay (HOSPITAL_COMMUNITY): Payer: Medicaid Other

## 2019-06-14 LAB — GLUCOSE, CAPILLARY
Glucose-Capillary: 123 mg/dL — ABNORMAL HIGH (ref 70–99)
Glucose-Capillary: 124 mg/dL — ABNORMAL HIGH (ref 70–99)
Glucose-Capillary: 124 mg/dL — ABNORMAL HIGH (ref 70–99)
Glucose-Capillary: 126 mg/dL — ABNORMAL HIGH (ref 70–99)
Glucose-Capillary: 128 mg/dL — ABNORMAL HIGH (ref 70–99)
Glucose-Capillary: 129 mg/dL — ABNORMAL HIGH (ref 70–99)
Glucose-Capillary: 137 mg/dL — ABNORMAL HIGH (ref 70–99)
Glucose-Capillary: 140 mg/dL — ABNORMAL HIGH (ref 70–99)
Glucose-Capillary: 145 mg/dL — ABNORMAL HIGH (ref 70–99)
Glucose-Capillary: 146 mg/dL — ABNORMAL HIGH (ref 70–99)
Glucose-Capillary: 158 mg/dL — ABNORMAL HIGH (ref 70–99)
Glucose-Capillary: 168 mg/dL — ABNORMAL HIGH (ref 70–99)
Glucose-Capillary: 172 mg/dL — ABNORMAL HIGH (ref 70–99)
Glucose-Capillary: 186 mg/dL — ABNORMAL HIGH (ref 70–99)
Glucose-Capillary: 189 mg/dL — ABNORMAL HIGH (ref 70–99)
Glucose-Capillary: 199 mg/dL — ABNORMAL HIGH (ref 70–99)
Glucose-Capillary: 217 mg/dL — ABNORMAL HIGH (ref 70–99)
Glucose-Capillary: 229 mg/dL — ABNORMAL HIGH (ref 70–99)
Glucose-Capillary: 253 mg/dL — ABNORMAL HIGH (ref 70–99)
Glucose-Capillary: 260 mg/dL — ABNORMAL HIGH (ref 70–99)
Glucose-Capillary: 88 mg/dL (ref 70–99)

## 2019-06-14 LAB — BASIC METABOLIC PANEL
Anion gap: 23 — ABNORMAL HIGH (ref 5–15)
Anion gap: 28 — ABNORMAL HIGH (ref 5–15)
Anion gap: 27 — ABNORMAL HIGH (ref 5–15)
Anion gap: 29 — ABNORMAL HIGH (ref 5–15)
Anion gap: 31 — ABNORMAL HIGH (ref 5–15)
BUN: 48 mg/dL — ABNORMAL HIGH (ref 8–23)
BUN: 53 mg/dL — ABNORMAL HIGH (ref 8–23)
BUN: 56 mg/dL — ABNORMAL HIGH (ref 8–23)
BUN: 57 mg/dL — ABNORMAL HIGH (ref 8–23)
BUN: 61 mg/dL — ABNORMAL HIGH (ref 8–23)
CO2: 18 mmol/L — ABNORMAL LOW (ref 22–32)
CO2: 16 mmol/L — ABNORMAL LOW (ref 22–32)
CO2: 16 mmol/L — ABNORMAL LOW (ref 22–32)
CO2: 15 mmol/L — ABNORMAL LOW (ref 22–32)
CO2: 13 mmol/L — ABNORMAL LOW (ref 22–32)
Calcium: 8.2 mg/dL — ABNORMAL LOW (ref 8.9–10.3)
Calcium: 8.6 mg/dL — ABNORMAL LOW (ref 8.9–10.3)
Calcium: 8.4 mg/dL — ABNORMAL LOW (ref 8.9–10.3)
Calcium: 8.5 mg/dL — ABNORMAL LOW (ref 8.9–10.3)
Calcium: 8.3 mg/dL — ABNORMAL LOW (ref 8.9–10.3)
Chloride: 102 mmol/L (ref 98–111)
Chloride: 102 mmol/L (ref 98–111)
Chloride: 103 mmol/L (ref 98–111)
Chloride: 102 mmol/L (ref 98–111)
Chloride: 105 mmol/L (ref 98–111)
Creatinine, Ser: 3.12 mg/dL — ABNORMAL HIGH (ref 0.44–1.00)
Creatinine, Ser: 3.54 mg/dL — ABNORMAL HIGH (ref 0.44–1.00)
Creatinine, Ser: 3.77 mg/dL — ABNORMAL HIGH (ref 0.44–1.00)
Creatinine, Ser: 4.02 mg/dL — ABNORMAL HIGH (ref 0.44–1.00)
Creatinine, Ser: 4.24 mg/dL — ABNORMAL HIGH (ref 0.44–1.00)
GFR calc Af Amer: 18 mL/min — ABNORMAL LOW (ref >60)
GFR calc Af Amer: 15 mL/min — ABNORMAL LOW (ref >60)
GFR calc Af Amer: 14 mL/min — ABNORMAL LOW (ref >60)
GFR calc Af Amer: 13 mL/min — ABNORMAL LOW (ref >60)
GFR calc Af Amer: 12 mL/min — ABNORMAL LOW (ref >60)
GFR calc non Af Amer: 15 mL/min — ABNORMAL LOW (ref >60)
GFR calc non Af Amer: 13 mL/min — ABNORMAL LOW (ref >60)
GFR calc non Af Amer: 12 mL/min — ABNORMAL LOW (ref >60)
GFR calc non Af Amer: 11 mL/min — ABNORMAL LOW (ref >60)
GFR calc non Af Amer: 11 mL/min — ABNORMAL LOW (ref >60)
Glucose, Bld: 271 mg/dL — ABNORMAL HIGH (ref 70–99)
Glucose, Bld: 196 mg/dL — ABNORMAL HIGH (ref 70–99)
Glucose, Bld: 179 mg/dL — ABNORMAL HIGH (ref 70–99)
Glucose, Bld: 140 mg/dL — ABNORMAL HIGH (ref 70–99)
Glucose, Bld: 130 mg/dL — ABNORMAL HIGH (ref 70–99)
Potassium: 3.4 mmol/L — ABNORMAL LOW (ref 3.5–5.1)
Potassium: 3.5 mmol/L (ref 3.5–5.1)
Potassium: 4 mmol/L (ref 3.5–5.1)
Potassium: 3.9 mmol/L (ref 3.5–5.1)
Potassium: 4 mmol/L (ref 3.5–5.1)
Sodium: 143 mmol/L (ref 135–145)
Sodium: 146 mmol/L — ABNORMAL HIGH (ref 135–145)
Sodium: 146 mmol/L — ABNORMAL HIGH (ref 135–145)
Sodium: 146 mmol/L — ABNORMAL HIGH (ref 135–145)
Sodium: 149 mmol/L — ABNORMAL HIGH (ref 135–145)

## 2019-06-14 LAB — POCT I-STAT 7, (LYTES, BLD GAS, ICA,H+H)
Acid-base deficit: 11 mmol/L — ABNORMAL HIGH (ref 0.0–2.0)
Bicarbonate: 14.9 mmol/L — ABNORMAL LOW (ref 20.0–28.0)
Calcium, Ion: 1.12 mmol/L — ABNORMAL LOW (ref 1.15–1.40)
HCT: 30 % — ABNORMAL LOW (ref 36.0–46.0)
Hemoglobin: 10.2 g/dL — ABNORMAL LOW (ref 12.0–15.0)
O2 Saturation: 94 %
Patient temperature: 100.2
Potassium: 3.1 mmol/L — ABNORMAL LOW (ref 3.5–5.1)
Sodium: 142 mmol/L (ref 135–145)
TCO2: 16 mmol/L — ABNORMAL LOW (ref 22–32)
pCO2 arterial: 32.5 mmHg (ref 32.0–48.0)
pH, Arterial: 7.275 — ABNORMAL LOW (ref 7.350–7.450)
pO2, Arterial: 82 mmHg — ABNORMAL LOW (ref 83.0–108.0)

## 2019-06-14 LAB — HEPATIC FUNCTION PANEL
ALT: 1705 U/L — ABNORMAL HIGH (ref 0–44)
AST: 3362 U/L — ABNORMAL HIGH (ref 15–41)
Albumin: 2 g/dL — ABNORMAL LOW (ref 3.5–5.0)
Alkaline Phosphatase: 99 U/L (ref 38–126)
Bilirubin, Direct: 0.2 mg/dL (ref 0.0–0.2)
Indirect Bilirubin: 0.5 mg/dL (ref 0.3–0.9)
Total Bilirubin: 0.7 mg/dL (ref 0.3–1.2)
Total Protein: 4.9 g/dL — ABNORMAL LOW (ref 6.5–8.1)

## 2019-06-14 LAB — CBC
HCT: 32.3 % — ABNORMAL LOW (ref 36.0–46.0)
Hemoglobin: 10.3 g/dL — ABNORMAL LOW (ref 12.0–15.0)
MCH: 25.7 pg — ABNORMAL LOW (ref 26.0–34.0)
MCHC: 31.9 g/dL (ref 30.0–36.0)
MCV: 80.5 fL (ref 80.0–100.0)
Platelets: 226 10*3/uL (ref 150–400)
RBC: 4.01 MIL/uL (ref 3.87–5.11)
RDW: 16.5 % — ABNORMAL HIGH (ref 11.5–15.5)
WBC: 7.1 10*3/uL (ref 4.0–10.5)
nRBC: 2.4 % — ABNORMAL HIGH (ref 0.0–0.2)

## 2019-06-14 LAB — PROTIME-INR
INR: 1.7 — ABNORMAL HIGH (ref 0.8–1.2)
Prothrombin Time: 20.1 seconds — ABNORMAL HIGH (ref 11.4–15.2)

## 2019-06-14 LAB — LACTIC ACID, PLASMA: Lactic Acid, Venous: 3.2 mmol/L (ref 0.5–1.9)

## 2019-06-14 LAB — PHOSPHORUS
Phosphorus: 4.2 mg/dL (ref 2.5–4.6)
Phosphorus: 5.8 mg/dL — ABNORMAL HIGH (ref 2.5–4.6)

## 2019-06-14 LAB — MAGNESIUM
Magnesium: 2.2 mg/dL (ref 1.7–2.4)
Magnesium: 2.5 mg/dL — ABNORMAL HIGH (ref 1.7–2.4)

## 2019-06-14 LAB — TRIGLYCERIDES: Triglycerides: 437 mg/dL — ABNORMAL HIGH (ref <150)

## 2019-06-14 MED ORDER — MIDAZOLAM HCL 2 MG/2ML IJ SOLN
2.0000 mg | INTRAMUSCULAR | Status: DC | PRN
  Administered 2019-06-14 (×2): 2 mg via INTRAVENOUS
  Filled 2019-06-14: qty 2

## 2019-06-14 MED ORDER — PIPERACILLIN-TAZOBACTAM IN DEX 2-0.25 GM/50ML IV SOLN
2.2500 g | Freq: Four times a day (QID) | INTRAVENOUS | Status: AC
  Administered 2019-06-15 (×3): 2.25 g via INTRAVENOUS
  Filled 2019-06-14 (×3): qty 50

## 2019-06-14 MED ORDER — PIPERACILLIN-TAZOBACTAM 3.375 G IVPB 30 MIN
3.3750 g | Freq: Once | INTRAVENOUS | Status: AC
  Administered 2019-06-15: 01:00:00 3.375 g via INTRAVENOUS
  Filled 2019-06-14: qty 50

## 2019-06-14 MED ORDER — MIDAZOLAM 50MG/50ML (1MG/ML) PREMIX INFUSION
0.5000 mg/h | INTRAVENOUS | Status: DC
  Administered 2019-06-14: 2 mg/h via INTRAVENOUS
  Administered 2019-06-14: 1 mg/h via INTRAVENOUS
  Filled 2019-06-14 (×2): qty 50

## 2019-06-14 MED ORDER — SODIUM CHLORIDE 0.9 % IV BOLUS
500.0000 mL | Freq: Once | INTRAVENOUS | Status: AC
  Administered 2019-06-14: 500 mL via INTRAVENOUS

## 2019-06-14 MED ORDER — FENTANYL 2500MCG IN NS 250ML (10MCG/ML) PREMIX INFUSION
0.0000 ug/h | INTRAVENOUS | Status: DC
  Administered 2019-06-14: 11:00:00 200 ug/h via INTRAVENOUS
  Administered 2019-06-15: 03:00:00 150 ug/h via INTRAVENOUS
  Administered 2019-06-16: 05:00:00 100 ug/h via INTRAVENOUS
  Filled 2019-06-14 (×3): qty 250

## 2019-06-14 MED ORDER — MIDAZOLAM HCL 2 MG/2ML IJ SOLN
2.0000 mg | INTRAMUSCULAR | Status: AC | PRN
  Administered 2019-06-14 (×3): 2 mg via INTRAVENOUS
  Filled 2019-06-14 (×3): qty 2

## 2019-06-14 MED ORDER — ACETYLCYSTEINE LOAD VIA INFUSION
150.0000 mg/kg | Freq: Once | INTRAVENOUS | Status: AC
  Administered 2019-06-14: 01:00:00 18720 mg via INTRAVENOUS
  Filled 2019-06-14: qty 468

## 2019-06-14 MED ORDER — PIPERACILLIN-TAZOBACTAM 3.375 G IVPB
3.3750 g | Freq: Three times a day (TID) | INTRAVENOUS | Status: DC
  Filled 2019-06-14: qty 50

## 2019-06-14 MED ORDER — POTASSIUM CHLORIDE 20 MEQ PO PACK
40.0000 meq | PACK | Freq: Two times a day (BID) | ORAL | Status: DC
  Administered 2019-06-14 (×2): 40 meq via ORAL
  Filled 2019-06-14 (×3): qty 2

## 2019-06-14 NOTE — Progress Notes (Addendum)
[facility] Progress Note Patient Name: Melissa Foley DOB: 1956-04-12 MRN: [id number]   Date of Service  06/14/2019  HPI/Events of Note  Notified by RN of poison control recommendations to restart acetylcysteine.    Discussed with poison control myself and LFTs are still trending up.  Acetaminophen level is already down.    eICU Interventions  Give loading dose of acetylcysteine for 1 hour and continue infusion for about 24 hours.  Continue to trend LFTs.     Intervention Category Major Interventions: Other:  Melissa Foley 06/14/2019, 1:08 AM   4:45 AM Triglycerides elevated at 437 while on propofol.   Plan> Switch to versed gtt.

## 2019-06-14 NOTE — Progress Notes (Signed)
ABG results reported to RN.

## 2019-06-14 NOTE — Progress Notes (Signed)
Did not switch to a dextrose solution per DKA protocol d/t patient receiving dextrose with acetylcysteine and tube feeding with tolerance. NS kept as maintain infusion.  May need readdress when acetylcysteine is complete. Discussed with EMD and she was in agreement.

## 2019-06-14 NOTE — Progress Notes (Signed)
Assisted tele visit to patient with family member.  Klyde Banka M, RN  

## 2019-06-14 NOTE — Progress Notes (Signed)
CRITICAL VALUE ALERT  Critical Value:  Lactic acid 3.2  Date & Time Notied:  06/14/2019 2355  Provider Notified: Warren Lacy  Orders Received/Actions taken:

## 2019-06-14 NOTE — Progress Notes (Signed)
[facility] Progress Note Patient Name: Melissa Foley DOB: [birth year] MRN: [id number]   Date of Service  06/14/2019  HPI/Events of Note  Notified of hypotension and oliguria.  Also febrile at 101.7. Admitted 2 days ago for AMS, hyperosmolar coma vs acetaminophen OD.  Patient has central line on groin.  eICU Interventions   Obtain blood cultures x 2 one from central line  Get CXR, CBC and lactate as part of infection work-up.  Bolus 500 cc NS, may need pressors  Will start piperacillin-tazobactam empirically     Intervention Category Major Interventions: Hypotension - evaluation and management Intermediate Interventions: Oliguria - evaluation and management  Shona Needles Kiefer Opheim 06/14/2019, 10:30 PM

## 2019-06-14 NOTE — Progress Notes (Signed)
Late entry.  This Probation officer updated pt daughter Caryl Pina early afternoon.  All questions answered to satisfaction.  Irven [place], RN

## 2019-06-14 NOTE — Progress Notes (Signed)
Pharmacy Antibiotic Note  Melissa Foley is a 63 y.o. female admitted on 06/16/2019 with pneumonia.  New onset hypotension, oliguria. Tmax 101.7. Renal function worsening. Pharmacy has been consulted for Zosyn dosing.  Plan: Zosyn 3.375g IV x1 Zosyn 2.25g IV q6h Monitor renal function, clinical signs of infection  Height: 5\' 7"  (170.2 cm) Weight: 277 lb 5.4 oz (125.8 kg) IBW/kg (Calculated) : 61.6  Temp (24hrs), Avg:100.2 F (37.9 C), Min:98.6 F (37 C), Max:101.5 F (38.6 C)  Recent Labs  Lab 06/11/2019 2213  06/13/19 0259 06/13/19 0449  06/14/19 0239 06/14/19 1004 06/14/19 1303 06/14/19 1711 06/14/19 2040  WBC 16.3*  --   --  16.4*  --   --   --   --   --   --   CREATININE 2.62*   < >  --  2.50*   < > 3.12* 3.54* 3.77* 4.02* 4.24*  LATICACIDVEN 4.8*  --  1.6  --   --   --   --   --   --   --    < > = values in this interval not displayed.    Estimated Creatinine Clearance: 19.2 mL/min (A) (by C-G formula based on SCr of 4.24 mg/dL (H)).    No Known Allergies  Antimicrobials this admission: Zosyn 7/12 >>   Dose adjustments this admission: None  Microbiology results: 7/12 BCx: Ordered 7/10 MRSA PCR: neg  Thank you for allowing pharmacy to be a part of this patient's care.  Richardine Service, PharmD PGY1 Pharmacy Resident Direct Line: [phone] 06/14/2019 10:40 PM

## 2019-06-14 NOTE — Progress Notes (Signed)
NAME:  Melissa Foley, MRN:  [id number], DOB:  Jun 01, 1956, LOS: 2 ADMISSION DATE:  06/16/2019, CONSULTATION DATE:  06/27/2019 REFERRING MD:  [facility], CHIEF COMPLAINT:  AMS  Brief History   63 yo F found unresponsive by neighbor, seizing in ED, found to have Acetaminophen toxicity and Glu > 700. Intubated and has R fem CVC  History of present illness   63 yo F PMH DM, HTN, COPD, CKD III, CVAs, Seizure Disorder, who presents to [facility] ED via EMS for AMS. Patient was found unresponsive on her couch by a neighbor who then dispatched EMS. Per EMS, patient was exhibiting respiratory distress, as well as rhythmic eye movement. In ED, patient continues to be unresponsive. Patient noted to have SpO2 70s. No change in mental status with Narcan. Nasal trumpet placed and rhythmic facial and eye contractions noted and was thought to be actively seizing. Ativan was administered without resolution. Patient then intubated. Patient noted to have glucose >900 without ketones in urine, found to have acetaminophen level 44, transaminitis, ProBNP >1000.  CT scan report states no acute intracranial abnormality.   Patient started on insulin gtt and NAC   Past Medical History  CKD III HTN HLD DM CVA Seizure Disorder COPD  Significant Hospital Events   7/10 intubated at [facility], CVC placed, [facility]  7/11 No overnight events No seizures noted overnight  Consults:   Procedures:  7/10 ETT>> 7/10 R Fem CVC>>   Significant Diagnostic Tests:  7/10 CT Head non-con Oval Linsey) read: no acute intracranial abnormality 7/10 CXR Oval Linsey) read: no cardiomegaly, mild bibasilar atelectasis, no pulmonary edema   Micro Data:  7/10 SARS CoV2> negative Oval Linsey performed)   Antimicrobials:   Interim history/subjective:  Continues on NAC infusion.  Propofol stopped for high triglycerides. Marked agitation since then.  Objective   Blood pressure 108/67, pulse (!) 116, temperature 99.3 F (37.4  C), resp. rate (!) 36, height 5\' 7"  (1.702 m), weight 125.8 kg, SpO2 94 %.    Vent Mode: PRVC FiO2 (%):  [40 %] 40 % Set Rate:  [16 bmp] 16 bmp Vt Set:  [450 mL] 450 mL PEEP:  [5 cmH20] 5 cmH20 Pressure Support:  [10 cmH20] 10 cmH20 Plateau Pressure:  [14 cmH20-18 cmH20] 14 cmH20   Intake/Output Summary (Last 24 hours) at 06/14/2019 1014 Last data filed at 06/14/2019 0743 Gross per 24 hour  Intake 4418.65 ml  Output 1100 ml  Net 3318.65 ml   Filed Weights   07/02/2019 2227 06/13/19 0500 06/14/19 0500  Weight: 121.2 kg 124.8 kg 125.8 kg   Examination: Critically ill, morbidly obese with significant agitation. Moist oral mucosa Trachea midline, ETT and OGT in place with no pressure ulceration.  S1-S2 appreciated, extremities warm.  Tachypnea with diffuse rhonchi. Abdomen is soft, bowel sounds appreciated Agitated moving around in bed. Not following commands and not orienting to voice.   Resolved Hospital Problem list     Assessment & Plan:   Critically ill due to acute hypoxic respiratory failure requiring mechanical ventilation. Significant agitation this morning and copious rhonchi. -Continue full ventilatory support given severe agitation and possible worsening hepatic failure and renal failure  Acetaminophen toxicity with worsening transaminitis Unclear whether this was an intentional overdose -Continue NAC-acetaminophen for at least 48h and consider extended infusion if hepatic function continues to worsen. -Follow INR as marker of hepatic function.  Hyperosmolar Hyperglycemic state without ketosis.  -Continue IV insulin.  -Given rapidly changing condition would be slow to transition to [place] insulin.  Acute on chronic kidney disease -Continue IV fluid -Trend electrolytes -Monitor I/O  Critically ill due to toxic metabolic encephalopathy requiring re-initiation of sedative infusions. Combination of HHS and acetaminophen overdose.  - Continue IV sedation to target  RASS goal - Continue IV sedation to maintain comfort until signs of hepatic/renal recovery.  Seizure Disorder  -Per [facility]sey chart, actively seizing on presentation. Report of features is variable.  -Lactic Acid 2.4 at [place] load at [employer] -Continue aggressive management of electrolyte derangement  Possible congestive heart failure Elevated BNP but relatively normal Echo. - Follow clinically, no cardiac interventions for now.   Best practice:  Diet: Start tube feeds per protocol Pain/Anxiety/Delirium protocol (if indicated): fentanyl infusion and midazolam infusion VAP protocol (if indicated): Yes DVT prophylaxis: SQ heparin GI prophylaxis: Protonix Glucose control: insulin gtt Mobility: BR Code Status: Full  Family Communication: Pending Disposition: Admit to ICU   Labs   CBC: Recent Labs  Lab 07/01/2019 2213 06/07/2019 2221 06/13/19 0449  WBC 16.3*  --  16.4*  HGB 11.9* 11.9* 11.8*  HCT 37.3 35.0* 36.5  MCV 79.7*  --  80.4  PLT 292  --  176    Basic Metabolic Panel: Recent Labs  Lab 06/16/2019 2213  06/13/19 0449 06/13/19 1145 06/13/19 1700 06/13/19 1935 06/14/19 0239  NA 144   < > 143 143 143 141 143  K 3.1*   < > 3.0* 3.3* 3.4* 3.4* 3.4*  CL 102   < > 103 103 104 104 102  CO2 21*   < > 22 21* 20* 19* 18*  GLUCOSE 212*   < > 241* 160* 171* 228* 271*  BUN 35*   < > 35* 36* 39* 38* 48*  CREATININE 2.62*   < > 2.50* 2.41* 2.83* 2.93* 3.12*  CALCIUM 8.4*   < > 7.9* 8.2* 8.3* 8.2* 8.2*  MG 2.4  --  2.2 2.1 2.1  --  2.2  PHOS 5.9*  --  4.3 3.1 3.3  --  4.2   < > = values in this interval not displayed.   GFR: Estimated Creatinine Clearance: 26.1 mL/min (A) (by C-G formula based on SCr of 3.12 mg/dL (H)). Recent Labs  Lab 06/18/2019 2213 06/13/19 0259 06/13/19 0449  WBC 16.3*  --  16.4*  LATICACIDVEN 4.8* 1.6  --     Liver Function Tests: Recent Labs  Lab 06/24/2019 2213 06/13/19 0449 06/14/19 0239  AST 555* 599* 3,362*  ALT 314* 358*  1,705*  ALKPHOS 119 116 99  BILITOT 0.9 0.8 0.7  PROT 6.0* 5.6* 4.9*  ALBUMIN 2.7* 2.6* 2.0*   No results for input(s): LIPASE, AMYLASE in the last 168 hours. No results for input(s): AMMONIA in the last 168 hours.  ABG    Component Value Date/Time   PHART 7.395 06/19/2019 2221   PCO2ART 31.9 (L) 06/16/2019 2221   PO2ART 159.0 (H) 06/25/2019 2221   HCO3 19.6 (L) 06/15/2019 2221   TCO2 21 (L) 07/02/2019 2221   ACIDBASEDEF 4.0 (H) 06/15/2019 2221   O2SAT 99.0 06/24/2019 2221     Coagulation Profile: Recent Labs  Lab 06/30/2019 2232 06/13/19 0242 06/13/19 0449  INR 1.4* 1.5* 1.4*    Cardiac Enzymes: Recent Labs  Lab 06/19/2019 2213  CKTOTAL 66    HbA1C: No results found for: HGBA1C  CBG: Recent Labs  Lab 06/13/19 2137 06/13/19 2240 06/13/19 2344 06/14/19 0047 06/14/19 0254  GLUCAP 221* 225* 217* 172* 260*    CRITICAL CARE Performed by: Kipp Brood  Total critical care time: 35 minutes  Critical care time was exclusive of separately billable procedures and treating other patients.  Critical care was necessary to treat or prevent imminent or life-threatening deterioration.  Critical care was time spent personally by me on the following activities: development of treatment plan with patient and/or surrogate as well as nursing, discussions with consultants, evaluation of patient's response to treatment, examination of patient, obtaining history from patient or surrogate, ordering and performing treatments and interventions, ordering and review of laboratory studies, ordering and review of radiographic studies, pulse oximetry, re-evaluation of patient's condition and participation in multidisciplinary rounds.  Kipp Brood, MD [facility] ICU Physician [place]  Pager: [phone] Mobile: [phone] After hours: [phone].

## 2019-06-14 NOTE — Progress Notes (Signed)
RN called d/t pt agitated w/ RR 38-40 bpm, sat 90% on 40% fio2- RN trying to sedate pt now.  Increased fio2 to 50%.   Per MD, wait to obtain ABG once pt is better sedated.

## 2019-06-15 ENCOUNTER — Encounter (HOSPITAL_COMMUNITY): Payer: Self-pay | Admitting: Internal Medicine

## 2019-06-15 ENCOUNTER — Inpatient Hospital Stay (HOSPITAL_COMMUNITY): Payer: Medicaid Other

## 2019-06-15 DIAGNOSIS — T391X1A Poisoning by 4-Aminophenol derivatives, accidental (unintentional), initial encounter: Principal | ICD-10-CM

## 2019-06-15 DIAGNOSIS — T391X4S Poisoning by 4-Aminophenol derivatives, undetermined, sequela: Secondary | ICD-10-CM

## 2019-06-15 DIAGNOSIS — K7290 Hepatic failure, unspecified without coma: Secondary | ICD-10-CM

## 2019-06-15 DIAGNOSIS — E872 Acidosis: Secondary | ICD-10-CM

## 2019-06-15 DIAGNOSIS — N179 Acute kidney failure, unspecified: Secondary | ICD-10-CM

## 2019-06-15 LAB — URINALYSIS, ROUTINE W REFLEX MICROSCOPIC
Bilirubin Urine: NEGATIVE
Glucose, UA: NEGATIVE mg/dL
Ketones, ur: NEGATIVE mg/dL
Nitrite: NEGATIVE
Protein, ur: 30 mg/dL — AB
Specific Gravity, Urine: 1.016 (ref 1.005–1.030)
WBC, UA: >50 WBC/hpf — ABNORMAL HIGH (ref 0–5)
pH: 5 (ref 5.0–8.0)

## 2019-06-15 LAB — POCT I-STAT 7, (LYTES, BLD GAS, ICA,H+H)
Acid-base deficit: 17 mmol/L — ABNORMAL HIGH (ref 0.0–2.0)
Acid-base deficit: 16 mmol/L — ABNORMAL HIGH (ref 0.0–2.0)
Bicarbonate: 9.6 mmol/L — ABNORMAL LOW (ref 20.0–28.0)
Bicarbonate: 10.3 mmol/L — ABNORMAL LOW (ref 20.0–28.0)
Calcium, Ion: 1.11 mmol/L — ABNORMAL LOW (ref 1.15–1.40)
Calcium, Ion: 1.06 mmol/L — ABNORMAL LOW (ref 1.15–1.40)
HCT: 27 % — ABNORMAL LOW (ref 36.0–46.0)
HCT: 38 % (ref 36.0–46.0)
Hemoglobin: 9.2 g/dL — ABNORMAL LOW (ref 12.0–15.0)
Hemoglobin: 12.9 g/dL (ref 12.0–15.0)
O2 Saturation: 90 %
O2 Saturation: 93 %
Patient temperature: 37.1
Patient temperature: 37.5
Potassium: 4.4 mmol/L (ref 3.5–5.1)
Potassium: 4.3 mmol/L (ref 3.5–5.1)
Sodium: 143 mmol/L (ref 135–145)
Sodium: 141 mmol/L (ref 135–145)
TCO2: 10 mmol/L — ABNORMAL LOW (ref 22–32)
TCO2: 11 mmol/L — ABNORMAL LOW (ref 22–32)
pCO2 arterial: 26.4 mmHg — ABNORMAL LOW (ref 32.0–48.0)
pCO2 arterial: 26.9 mmHg — ABNORMAL LOW (ref 32.0–48.0)
pH, Arterial: 7.171 — CL (ref 7.350–7.450)
pH, Arterial: 7.196 — CL (ref 7.350–7.450)
pO2, Arterial: 71 mmHg — ABNORMAL LOW (ref 83.0–108.0)
pO2, Arterial: 84 mmHg (ref 83.0–108.0)

## 2019-06-15 LAB — RENAL FUNCTION PANEL
Albumin: 1.6 g/dL — ABNORMAL LOW (ref 3.5–5.0)
Albumin: 1.4 g/dL — ABNORMAL LOW (ref 3.5–5.0)
Anion gap: 33 — ABNORMAL HIGH (ref 5–15)
Anion gap: 30 — ABNORMAL HIGH (ref 5–15)
BUN: 67 mg/dL — ABNORMAL HIGH (ref 8–23)
BUN: 61 mg/dL — ABNORMAL HIGH (ref 8–23)
CO2: 13 mmol/L — ABNORMAL LOW (ref 22–32)
CO2: 10 mmol/L — ABNORMAL LOW (ref 22–32)
Calcium: 8.2 mg/dL — ABNORMAL LOW (ref 8.9–10.3)
Calcium: 7.4 mg/dL — ABNORMAL LOW (ref 8.9–10.3)
Chloride: 104 mmol/L (ref 98–111)
Chloride: 107 mmol/L (ref 98–111)
Creatinine, Ser: 5.05 mg/dL — ABNORMAL HIGH (ref 0.44–1.00)
Creatinine, Ser: 3.98 mg/dL — ABNORMAL HIGH (ref 0.44–1.00)
GFR calc Af Amer: 10 mL/min — ABNORMAL LOW (ref >60)
GFR calc Af Amer: 13 mL/min — ABNORMAL LOW (ref >60)
GFR calc non Af Amer: 9 mL/min — ABNORMAL LOW (ref >60)
GFR calc non Af Amer: 11 mL/min — ABNORMAL LOW (ref >60)
Glucose, Bld: 233 mg/dL — ABNORMAL HIGH (ref 70–99)
Glucose, Bld: 130 mg/dL — ABNORMAL HIGH (ref 70–99)
Phosphorus: 6.2 mg/dL — ABNORMAL HIGH (ref 2.5–4.6)
Phosphorus: 5.4 mg/dL — ABNORMAL HIGH (ref 2.5–4.6)
Potassium: 4.8 mmol/L (ref 3.5–5.1)
Potassium: 3.8 mmol/L (ref 3.5–5.1)
Sodium: 150 mmol/L — ABNORMAL HIGH (ref 135–145)
Sodium: 147 mmol/L — ABNORMAL HIGH (ref 135–145)

## 2019-06-15 LAB — HEPATIC FUNCTION PANEL
ALT: 4740 U/L — ABNORMAL HIGH (ref 0–44)
AST: >10000 U/L — ABNORMAL HIGH (ref 15–41)
Albumin: 1.7 g/dL — ABNORMAL LOW (ref 3.5–5.0)
Alkaline Phosphatase: 154 U/L — ABNORMAL HIGH (ref 38–126)
Bilirubin, Direct: 0.3 mg/dL — ABNORMAL HIGH (ref 0.0–0.2)
Indirect Bilirubin: 0.6 mg/dL (ref 0.3–0.9)
Total Bilirubin: 0.9 mg/dL (ref 0.3–1.2)
Total Protein: 4.9 g/dL — ABNORMAL LOW (ref 6.5–8.1)

## 2019-06-15 LAB — GLUCOSE, CAPILLARY
Glucose-Capillary: 109 mg/dL — ABNORMAL HIGH (ref 70–99)
Glucose-Capillary: 114 mg/dL — ABNORMAL HIGH (ref 70–99)
Glucose-Capillary: 115 mg/dL — ABNORMAL HIGH (ref 70–99)
Glucose-Capillary: 122 mg/dL — ABNORMAL HIGH (ref 70–99)
Glucose-Capillary: 123 mg/dL — ABNORMAL HIGH (ref 70–99)
Glucose-Capillary: 125 mg/dL — ABNORMAL HIGH (ref 70–99)
Glucose-Capillary: 128 mg/dL — ABNORMAL HIGH (ref 70–99)
Glucose-Capillary: 130 mg/dL — ABNORMAL HIGH (ref 70–99)
Glucose-Capillary: 138 mg/dL — ABNORMAL HIGH (ref 70–99)
Glucose-Capillary: 140 mg/dL — ABNORMAL HIGH (ref 70–99)
Glucose-Capillary: 147 mg/dL — ABNORMAL HIGH (ref 70–99)
Glucose-Capillary: 149 mg/dL — ABNORMAL HIGH (ref 70–99)
Glucose-Capillary: 161 mg/dL — ABNORMAL HIGH (ref 70–99)
Glucose-Capillary: 162 mg/dL — ABNORMAL HIGH (ref 70–99)
Glucose-Capillary: 164 mg/dL — ABNORMAL HIGH (ref 70–99)
Glucose-Capillary: 176 mg/dL — ABNORMAL HIGH (ref 70–99)
Glucose-Capillary: 183 mg/dL — ABNORMAL HIGH (ref 70–99)
Glucose-Capillary: 192 mg/dL — ABNORMAL HIGH (ref 70–99)
Glucose-Capillary: 193 mg/dL — ABNORMAL HIGH (ref 70–99)
Glucose-Capillary: 194 mg/dL — ABNORMAL HIGH (ref 70–99)
Glucose-Capillary: 197 mg/dL — ABNORMAL HIGH (ref 70–99)
Glucose-Capillary: 198 mg/dL — ABNORMAL HIGH (ref 70–99)
Glucose-Capillary: 204 mg/dL — ABNORMAL HIGH (ref 70–99)

## 2019-06-15 LAB — AMMONIA: Ammonia: 75 umol/L — ABNORMAL HIGH (ref 9–35)

## 2019-06-15 LAB — LACTIC ACID, PLASMA: Lactic Acid, Venous: 2.9 mmol/L (ref 0.5–1.9)

## 2019-06-15 LAB — PROTIME-INR
INR: 1.5 — ABNORMAL HIGH (ref 0.8–1.2)
Prothrombin Time: 17.6 seconds — ABNORMAL HIGH (ref 11.4–15.2)

## 2019-06-15 LAB — ACETAMINOPHEN LEVEL: Acetaminophen (Tylenol), Serum: <10 ug/mL — ABNORMAL LOW (ref 10–30)

## 2019-06-15 LAB — HEMOGLOBIN A1C
Hgb A1c MFr Bld: >15.5 % — ABNORMAL HIGH (ref 4.8–5.6)
Mean Plasma Glucose: >398 mg/dL

## 2019-06-15 MED ORDER — FREE WATER
300.0000 mL | Status: DC
  Administered 2019-06-15 – 2019-06-16 (×8): 300 mL

## 2019-06-15 MED ORDER — SODIUM CHLORIDE 0.9 % IV BOLUS
500.0000 mL | Freq: Once | INTRAVENOUS | Status: AC
  Administered 2019-06-15: 500 mL via INTRAVENOUS

## 2019-06-15 MED ORDER — PRISMASOL BGK 4/2.5 32-4-2.5 MEQ/L REPLACEMENT SOLN
Status: DC
  Administered 2019-06-15 – 2019-06-16 (×2): via INTRAVENOUS_CENTRAL
  Filled 2019-06-15 (×4): qty 5000

## 2019-06-15 MED ORDER — HEPARIN SODIUM (PORCINE) 1000 UNIT/ML DIALYSIS
1000.0000 [IU] | INTRAMUSCULAR | Status: DC | PRN
  Filled 2019-06-15: qty 3

## 2019-06-15 MED ORDER — PIPERACILLIN-TAZOBACTAM 3.375 G IVPB
3.3750 g | Freq: Four times a day (QID) | INTRAVENOUS | Status: DC
  Administered 2019-06-15 – 2019-06-17 (×6): 3.375 g via INTRAVENOUS
  Filled 2019-06-15 (×7): qty 50

## 2019-06-15 MED ORDER — PRISMASOL BGK 4/2.5 32-4-2.5 MEQ/L IV SOLN
INTRAVENOUS | Status: DC
  Administered 2019-06-15 – 2019-06-17 (×10): via INTRAVENOUS_CENTRAL
  Filled 2019-06-15 (×30): qty 5000

## 2019-06-15 MED ORDER — STERILE WATER FOR INJECTION IV SOLN
INTRAVENOUS | Status: DC
  Administered 2019-06-15 (×2): via INTRAVENOUS
  Filled 2019-06-15 (×2): qty 850

## 2019-06-15 MED ORDER — NOREPINEPHRINE 16 MG/250ML-% IV SOLN
0.0000 ug/min | INTRAVENOUS | Status: DC
  Administered 2019-06-15: 5 ug/min via INTRAVENOUS
  Filled 2019-06-15 (×3): qty 250

## 2019-06-15 MED ORDER — LACTULOSE 10 GM/15ML PO SOLN
20.0000 g | Freq: Three times a day (TID) | ORAL | Status: DC
  Administered 2019-06-15 – 2019-06-16 (×5): 20 g via ORAL
  Filled 2019-06-15 (×5): qty 30

## 2019-06-15 MED ORDER — DEXMEDETOMIDINE HCL IN NACL 400 MCG/100ML IV SOLN
0.4000 ug/kg/h | INTRAVENOUS | Status: DC
  Administered 2019-06-15: 10:00:00 0.6 ug/kg/h via INTRAVENOUS
  Administered 2019-06-15: 1.2 ug/kg/h via INTRAVENOUS
  Administered 2019-06-15 (×2): 0.6 ug/kg/h via INTRAVENOUS
  Administered 2019-06-16: 0.8 ug/kg/h via INTRAVENOUS
  Administered 2019-06-16: 0.4 ug/kg/h via INTRAVENOUS
  Filled 2019-06-15 (×2): qty 100
  Filled 2019-06-15: qty 200
  Filled 2019-06-15 (×4): qty 100

## 2019-06-15 NOTE — Consult Note (Signed)
[facility]  INPATIENT CONSULTATION  Reason for Consultation: AKI, acidosis Requesting Provider: Dr. Halford Chessman  HPI: Melissa Foley is an 63 y.o. female with DM, HTN, COPD, h/o CVAs, seizure DO, CKD 3 who is currently admitted for AMS > APAP toxicity.  Nephrology is consulted for AKI on CKD and acidosis.   History obtained from chart as patient is intubated and sedated.  Found unresponsive by neighbor > EMS > [facility].  Hypoxic 70s, likely seizing, intubated.  BG > 900, UA neg ketones; APAP level 44, significant transaminitis.  CT head neg acute issue.  Started on NAC and insulin gtt, transferred to [facility] ICU 7/10.   Since arrival at [facility] has had worsening transaminitis and despite improved APAP level NAC has continued per poison control.  INRs stable in mid 1s.  CXR here with LLL opacity, presumed aspiration being treated with zosyn.  ABG this AM show 7.17 / 26 / 71 / 90% on 50% FiO2.  She was started on bicarb gtt.  Last fever 11pm last night 100.4. Now on NE gtt as of this AM.   Cr on arrival here 2.62 and has trended up to 5.05 this AM. Daily UOP trend 310 > 1100 > 675.  Unclear baseline creatinine but per chart CKD 3 -- 04/2018 cr at [facility] via care everywhere is 1.05 - 1.96.   PMH: CKD III HTN HLD DM CVA Seizure Disorder COPD  Medications:  I have reviewed the patient's current medications.  Medications Prior to Admission  Medication Sig Dispense Refill  . Acetaminophen (TYLENOL PO) Take by mouth.    Marland Kitchen amoxicillin (AMOXIL) 500 MG capsule Take 500 mg by mouth 3 (three) times daily.    . bisoprolol (ZEBETA) 10 MG tablet Take 10 mg by mouth daily.    Marland Kitchen BREO ELLIPTA 100-25 MCG/INH AEPB Inhale 1 puff into the lungs daily.     . busPIRone (BUSPAR) 5 MG tablet Take 10 mg by mouth 2 (two) times daily.     . diclofenac (VOLTAREN) 50 MG EC tablet Take 50 mg by mouth daily.    . Dulaglutide (TRULICITY) 8.93 YB/0.1BP SOPN Inject 1 Dose into the skin once a week.    . DULoxetine  (CYMBALTA) 60 MG capsule Take 120 mg by mouth daily.    . fenofibrate (TRICOR) 145 MG tablet Take 145 mg by mouth daily.     . Gabapentin, Once-Daily, (GRALISE) 300 MG TABS Take 300 mg by mouth at bedtime.    . insulin aspart (NOVOLOG FLEXPEN) 100 UNIT/ML FlexPen Inject 25-30 Units into the skin 2 (two) times daily. 30 units in the morning and 25 in the evening    . lamoTRIgine 50 MG TBDP Take 50 mg by mouth daily.    . mirabegron ER (MYRBETRIQ) 25 MG TB24 tablet Take 25 mg by mouth daily.    . pantoprazole (PROTONIX) 40 MG tablet Take 40 mg by mouth daily.    . Probiotic Product (ALIGN) 4 MG CAPS Take 4 mg by mouth daily.    . QUEtiapine (SEROQUEL) 100 MG tablet Take 100 mg by mouth at bedtime.    . rosuvastatin (CRESTOR) 20 MG tablet Take 20 mg by mouth at bedtime.    . sodium chloride 1 g tablet Take 1 g by mouth 3 (three) times daily with meals.    . terazosin (HYTRIN) 1 MG capsule Take 1 mg by mouth at bedtime.    . traZODone (DESYREL) 50 MG tablet Take 50 mg by mouth at  bedtime.       ALLERGIES:  No Known Allergies  FAM HX: No family history on file.  Social History:   has no history on file for tobacco, alcohol, and drug.  ROS: unable to obtain given sedated/intubated  Blood pressure (!) 79/37, pulse (!) 107, temperature 98.6 F (37 C), resp. rate (!) 24, height 5\' 7"  (1.702 m), weight 129.7 kg, SpO2 (!) 89 %. PHYSICAL EXAM: Gen: obese woman intubated and sedated  Eyes: pinpoint pupils, anicteric ENT: ETT in place Neck: obese CV: tachycardic, regular Abd: soft, obese, +BS Back: did not assess GU: foley draining amber urine Extr: 1+ LE edema Neuro: sedated Skin: cool and dry   Results for orders placed or performed during the hospital encounter of 06/09/2019 (from the past 48 hour(s))  Glucose, capillary     Status: Abnormal   Collection Time: 06/13/19  9:12 AM  Result Value Ref Range   Glucose-Capillary 168 (H) 70 - 99 mg/dL  Acetaminophen level     Status: Abnormal    Collection Time: 06/13/19 10:20 AM  Result Value Ref Range   Acetaminophen (Tylenol), Serum <10 (L) 10 - 30 ug/mL    Comment: (NOTE) Therapeutic concentrations vary significantly. A range of 10-30 ug/mL  may be an effective concentration for many patients. However, some  are best treated at concentrations outside of this range. Acetaminophen concentrations >150 ug/mL at 4 hours after ingestion  and >50 ug/mL at 12 hours after ingestion are often associated with  toxic reactions. Performed at [facility], [place] [address] [place], [place] [address]   Glucose, capillary     Status: Abnormal   Collection Time: 06/13/19 10:28 AM  Result Value Ref Range   Glucose-Capillary 162 (H) 70 - 99 mg/dL  Basic metabolic panel     Status: Abnormal   Collection Time: 06/13/19 11:45 AM  Result Value Ref Range   Sodium 143 135 - 145 mmol/L   Potassium 3.3 (L) 3.5 - 5.1 mmol/L   Chloride 103 98 - 111 mmol/L   CO2 21 (L) 22 - 32 mmol/L   Glucose, Bld 160 (H) 70 - 99 mg/dL   BUN 36 (H) 8 - 23 mg/dL   Creatinine, Ser 2.41 (H) 0.44 - 1.00 mg/dL   Calcium 8.2 (L) 8.9 - 10.3 mg/dL   GFR calc non Af Amer 21 (L) >60 mL/min   GFR calc Af Amer 24 (L) >60 mL/min   Anion gap 19 (H) 5 - 15    Comment: Performed at [facility] [address], [place], [place] [address]  Magnesium     Status: None   Collection Time: 06/13/19 11:45 AM  Result Value Ref Range   Magnesium 2.1 1.7 - 2.4 mg/dL    Comment: Performed at [facility] [address] [place], [place] [address]  Phosphorus     Status: None   Collection Time: 06/13/19 11:45 AM  Result Value Ref Range   Phosphorus 3.1 2.5 - 4.6 mg/dL    Comment: Performed at [facility] [address] [place], [place] [address]  Glucose, capillary     Status: Abnormal   Collection Time: 06/13/19 11:50 AM  Result Value Ref Range   Glucose-Capillary 143 (H) 70 - 99 mg/dL  Glucose, capillary     Status: Abnormal   Collection Time:  06/13/19  1:00 PM  Result Value Ref Range   Glucose-Capillary 154 (H) 70 - 99 mg/dL  Glucose, capillary  Status: Abnormal   Collection Time: 06/13/19  1:48 PM  Result Value Ref Range   Glucose-Capillary 135 (H) 70 - 99 mg/dL  Glucose, capillary     Status: Abnormal   Collection Time: 06/13/19  2:54 PM  Result Value Ref Range   Glucose-Capillary 134 (H) 70 - 99 mg/dL  Glucose, capillary     Status: Abnormal   Collection Time: 06/13/19  4:03 PM  Result Value Ref Range   Glucose-Capillary 154 (H) 70 - 99 mg/dL  Basic metabolic panel     Status: Abnormal   Collection Time: 06/13/19  5:00 PM  Result Value Ref Range   Sodium 143 135 - 145 mmol/L   Potassium 3.4 (L) 3.5 - 5.1 mmol/L   Chloride 104 98 - 111 mmol/L   CO2 20 (L) 22 - 32 mmol/L   Glucose, Bld 171 (H) 70 - 99 mg/dL   BUN 39 (H) 8 - 23 mg/dL   Creatinine, Ser 2.83 (H) 0.44 - 1.00 mg/dL   Calcium 8.3 (L) 8.9 - 10.3 mg/dL   GFR calc non Af Amer 17 (L) >60 mL/min   GFR calc Af Amer 20 (L) >60 mL/min   Anion gap 19 (H) 5 - 15    Comment: Performed at [facility], [place] [address], [place], [place] [address]  Magnesium     Status: None   Collection Time: 06/13/19  5:00 PM  Result Value Ref Range   Magnesium 2.1 1.7 - 2.4 mg/dL    Comment: Performed at [facility], [place] [address], [place], [place] [address]  Phosphorus     Status: None   Collection Time: 06/13/19  5:00 PM  Result Value Ref Range   Phosphorus 3.3 2.5 - 4.6 mg/dL    Comment: Performed at [facility] [address] [place], [place] [address]  Glucose, capillary     Status: Abnormal   Collection Time: 06/13/19  5:16 PM  Result Value Ref Range   Glucose-Capillary 164 (H) 70 - 99 mg/dL  Glucose, capillary     Status: Abnormal   Collection Time: 06/13/19  6:28 PM  Result Value Ref Range   Glucose-Capillary 166 (H) 70 - 99 mg/dL  Glucose, capillary     Status: Abnormal   Collection Time: 06/13/19  7:33 PM  Result Value Ref Range    Glucose-Capillary 205 (H) 70 - 99 mg/dL  Basic metabolic panel     Status: Abnormal   Collection Time: 06/13/19  7:35 PM  Result Value Ref Range   Sodium 141 135 - 145 mmol/L   Potassium 3.4 (L) 3.5 - 5.1 mmol/L   Chloride 104 98 - 111 mmol/L   CO2 19 (L) 22 - 32 mmol/L   Glucose, Bld 228 (H) 70 - 99 mg/dL   BUN 38 (H) 8 - 23 mg/dL   Creatinine, Ser 2.93 (H) 0.44 - 1.00 mg/dL   Calcium 8.2 (L) 8.9 - 10.3 mg/dL   GFR calc non Af Amer 17 (L) >60 mL/min   GFR calc Af Amer 19 (L) >60 mL/min   Anion gap 18 (H) 5 - 15    Comment: Performed at [facility], [place] [address] [place], [place] [address]  Glucose, capillary     Status: Abnormal   Collection Time: 06/13/19  8:33 PM  Result Value Ref Range   Glucose-Capillary 231 (H) 70 - 99 mg/dL  Glucose, capillary     Status: Abnormal   Collection Time:  06/13/19  9:37 PM  Result Value Ref Range   Glucose-Capillary 221 (H) 70 - 99 mg/dL  Glucose, capillary     Status: Abnormal   Collection Time: 06/13/19 10:40 PM  Result Value Ref Range   Glucose-Capillary 225 (H) 70 - 99 mg/dL  Glucose, capillary     Status: Abnormal   Collection Time: 06/13/19 11:44 PM  Result Value Ref Range   Glucose-Capillary 217 (H) 70 - 99 mg/dL  Glucose, capillary     Status: Abnormal   Collection Time: 06/14/19 12:47 AM  Result Value Ref Range   Glucose-Capillary 172 (H) 70 - 99 mg/dL  Triglycerides     Status: Abnormal   Collection Time: 06/14/19  2:39 AM  Result Value Ref Range   Triglycerides 437 (H) <150 mg/dL    Comment: Performed at [facility], [place] [address] [place], [place] [address]  Magnesium     Status: None   Collection Time: 06/14/19  2:39 AM  Result Value Ref Range   Magnesium 2.2 1.7 - 2.4 mg/dL    Comment: Performed at [facility] [address], [place], [place] [address]  Phosphorus     Status: None   Collection Time: 06/14/19  2:39 AM  Result Value Ref Range   Phosphorus 4.2 2.5 - 4.6 mg/dL    Comment: Performed  at [facility] [address], [place], [place] [address]  Hepatic function panel     Status: Abnormal   Collection Time: 06/14/19  2:39 AM  Result Value Ref Range   Total Protein 4.9 (L) 6.5 - 8.1 g/dL   Albumin 2.0 (L) 3.5 - 5.0 g/dL   AST 3,362 (H) 15 - 41 U/L    Comment: RESULTS CONFIRMED BY MANUAL DILUTION   ALT 1,705 (H) 0 - 44 U/L   Alkaline Phosphatase 99 38 - 126 U/L   Total Bilirubin 0.7 0.3 - 1.2 mg/dL   Bilirubin, Direct 0.2 0.0 - 0.2 mg/dL   Indirect Bilirubin 0.5 0.3 - 0.9 mg/dL    Comment: Performed at [facility], [place] [address] [place], [place] [address]  Basic metabolic panel     Status: Abnormal   Collection Time: 06/14/19  2:39 AM  Result Value Ref Range   Sodium 143 135 - 145 mmol/L   Potassium 3.4 (L) 3.5 - 5.1 mmol/L   Chloride 102 98 - 111 mmol/L   CO2 18 (L) 22 - 32 mmol/L   Glucose, Bld 271 (H) 70 - 99 mg/dL   BUN 48 (H) 8 - 23 mg/dL   Creatinine, Ser 3.12 (H) 0.44 - 1.00 mg/dL   Calcium 8.2 (L) 8.9 - 10.3 mg/dL   GFR calc non Af Amer 15 (L) >60 mL/min   GFR calc Af Amer 18 (L) >60 mL/min   Anion gap 23 (H) 5 - 15    Comment: RESULT CHECKED Performed at [facility] [address], [place], [place] [address]   Glucose, capillary     Status: Abnormal   Collection Time: 06/14/19  2:54 AM  Result Value Ref Range   Glucose-Capillary 260 (H) 70 - 99 mg/dL  Glucose, capillary     Status: Abnormal   Collection Time: 06/14/19  4:10 AM  Result Value Ref Range   Glucose-Capillary 253 (H) 70 - 99 mg/dL  Glucose, capillary     Status: Abnormal   Collection Time: 06/14/19  5:14 AM  Result Value Ref Range   Glucose-Capillary  217 (H) 70 - 99 mg/dL  Glucose, capillary     Status: Abnormal   Collection Time: 06/14/19  6:16 AM  Result Value Ref Range   Glucose-Capillary 229 (H) 70 - 99 mg/dL  Glucose, capillary     Status: Abnormal   Collection Time: 06/14/19  7:21 AM  Result Value Ref Range   Glucose-Capillary 199 (H) 70 - 99 mg/dL   Glucose, capillary     Status: Abnormal   Collection Time: 06/14/19  8:31 AM  Result Value Ref Range   Glucose-Capillary 124 (H) 70 - 99 mg/dL  Glucose, capillary     Status: Abnormal   Collection Time: 06/14/19  9:50 AM  Result Value Ref Range   Glucose-Capillary 186 (H) 70 - 99 mg/dL  Basic metabolic panel     Status: Abnormal   Collection Time: 06/14/19 10:04 AM  Result Value Ref Range   Sodium 146 (H) 135 - 145 mmol/L   Potassium 3.5 3.5 - 5.1 mmol/L   Chloride 102 98 - 111 mmol/L   CO2 16 (L) 22 - 32 mmol/L   Glucose, Bld 196 (H) 70 - 99 mg/dL   BUN 53 (H) 8 - 23 mg/dL   Creatinine, Ser 3.54 (H) 0.44 - 1.00 mg/dL   Calcium 8.6 (L) 8.9 - 10.3 mg/dL   GFR calc non Af Amer 13 (L) >60 mL/min   GFR calc Af Amer 15 (L) >60 mL/min   Anion gap 28 (H) 5 - 15    Comment: Performed at [facility], [place] [address] [place], [place] [address]  Protime-INR     Status: Abnormal   Collection Time: 06/14/19 10:04 AM  Result Value Ref Range   Prothrombin Time 20.1 (H) 11.4 - 15.2 seconds   INR 1.7 (H) 0.8 - 1.2    Comment: (NOTE) INR goal varies based on device and disease states. Performed at [facility], [place] [address], [place], [place] [address]   Glucose, capillary     Status: Abnormal   Collection Time: 06/14/19 11:13 AM  Result Value Ref Range   Glucose-Capillary 146 (H) 70 - 99 mg/dL  I-STAT 7, (LYTES, BLD GAS, ICA, H+H)     Status: Abnormal   Collection Time: 06/14/19 11:57 AM  Result Value Ref Range   pH, Arterial 7.275 (L) 7.350 - 7.450   pCO2 arterial 32.5 32.0 - 48.0 mmHg   pO2, Arterial 82.0 (L) 83.0 - 108.0 mmHg   Bicarbonate 14.9 (L) 20.0 - 28.0 mmol/L   TCO2 16 (L) 22 - 32 mmol/L   O2 Saturation 94.0 %   Acid-base deficit 11.0 (H) 0.0 - 2.0 mmol/L   Sodium 142 135 - 145 mmol/L   Potassium 3.1 (L) 3.5 - 5.1 mmol/L   Calcium, Ion 1.12 (L) 1.15 - 1.40 mmol/L   HCT 30.0 (L) 36.0 - 46.0 %   Hemoglobin 10.2 (L) 12.0 - 15.0 g/dL   Patient temperature  100.2 F    Collection site RADIAL, ALLEN'S TEST ACCEPTABLE    Drawn by Operator    Sample type ARTERIAL   Glucose, capillary     Status: Abnormal   Collection Time: 06/14/19 12:19 PM  Result Value Ref Range   Glucose-Capillary 145 (H) 70 - 99 mg/dL  Basic metabolic panel     Status: Abnormal   Collection Time: 06/14/19  1:03 PM  Result Value Ref Range   Sodium 146 (H) 135 - 145 mmol/L   Potassium 4.0 3.5 - 5.1 mmol/L  Comment: NO VISIBLE HEMOLYSIS   Chloride 103 98 - 111 mmol/L   CO2 16 (L) 22 - 32 mmol/L   Glucose, Bld 179 (H) 70 - 99 mg/dL   BUN 56 (H) 8 - 23 mg/dL   Creatinine, Ser 3.77 (H) 0.44 - 1.00 mg/dL   Calcium 8.4 (L) 8.9 - 10.3 mg/dL   GFR calc non Af Amer 12 (L) >60 mL/min   GFR calc Af Amer 14 (L) >60 mL/min   Anion gap 27 (H) 5 - 15    Comment: Performed at [facility] [address], [place], [place] [address]  Glucose, capillary     Status: Abnormal   Collection Time: 06/14/19  1:26 PM  Result Value Ref Range   Glucose-Capillary 189 (H) 70 - 99 mg/dL  Glucose, capillary     Status: Abnormal   Collection Time: 06/14/19  2:41 PM  Result Value Ref Range   Glucose-Capillary 158 (H) 70 - 99 mg/dL  Glucose, capillary     Status: Abnormal   Collection Time: 06/14/19  3:42 PM  Result Value Ref Range   Glucose-Capillary 126 (H) 70 - 99 mg/dL  Glucose, capillary     Status: Abnormal   Collection Time: 06/14/19  5:09 PM  Result Value Ref Range   Glucose-Capillary 129 (H) 70 - 99 mg/dL  Magnesium     Status: Abnormal   Collection Time: 06/14/19  5:11 PM  Result Value Ref Range   Magnesium 2.5 (H) 1.7 - 2.4 mg/dL    Comment: Performed at [facility], [place] [address], [place], [place] [address]  Phosphorus     Status: Abnormal   Collection Time: 06/14/19  5:11 PM  Result Value Ref Range   Phosphorus 5.8 (H) 2.5 - 4.6 mg/dL    Comment: Performed at [facility] [address] [place], [place] [address]  Basic metabolic panel     Status:  Abnormal   Collection Time: 06/14/19  5:11 PM  Result Value Ref Range   Sodium 146 (H) 135 - 145 mmol/L   Potassium 3.9 3.5 - 5.1 mmol/L   Chloride 102 98 - 111 mmol/L   CO2 15 (L) 22 - 32 mmol/L   Glucose, Bld 140 (H) 70 - 99 mg/dL   BUN 57 (H) 8 - 23 mg/dL   Creatinine, Ser 4.02 (H) 0.44 - 1.00 mg/dL   Calcium 8.5 (L) 8.9 - 10.3 mg/dL   GFR calc non Af Amer 11 (L) >60 mL/min   GFR calc Af Amer 13 (L) >60 mL/min   Anion gap 29 (H) 5 - 15    Comment: Performed at [facility] [address] [place], [place] [address]  Glucose, capillary     Status: Abnormal   Collection Time: 06/14/19  6:24 PM  Result Value Ref Range   Glucose-Capillary 128 (H) 70 - 99 mg/dL  Glucose, capillary     Status: Abnormal   Collection Time: 06/14/19  7:25 PM  Result Value Ref Range   Glucose-Capillary 123 (H) 70 - 99 mg/dL  Glucose, capillary     Status: Abnormal   Collection Time: 06/14/19  8:34 PM  Result Value Ref Range   Glucose-Capillary 124 (H) 70 - 99 mg/dL  Basic metabolic panel     Status: Abnormal   Collection Time: 06/14/19  8:40 PM  Result Value Ref Range   Sodium 149 (H) 135 - 145 mmol/L   Potassium 4.0 3.5 - 5.1 mmol/L  Chloride 105 98 - 111 mmol/L   CO2 13 (L) 22 - 32 mmol/L   Glucose, Bld 130 (H) 70 - 99 mg/dL   BUN 61 (H) 8 - 23 mg/dL   Creatinine, Ser 4.24 (H) 0.44 - 1.00 mg/dL   Calcium 8.3 (L) 8.9 - 10.3 mg/dL   GFR calc non Af Amer 11 (L) >60 mL/min   GFR calc Af Amer 12 (L) >60 mL/min   Anion gap 31 (H) 5 - 15    Comment: RESULT CHECKED Performed at [facility] [address], [place], [place] [address]   Glucose, capillary     Status: None   Collection Time: 06/14/19  9:34 PM  Result Value Ref Range   Glucose-Capillary 88 70 - 99 mg/dL  Glucose, capillary     Status: Abnormal   Collection Time: 06/14/19  9:36 PM  Result Value Ref Range   Glucose-Capillary 137 (H) 70 - 99 mg/dL  Glucose, capillary     Status: Abnormal   Collection Time: 06/14/19  10:41 PM  Result Value Ref Range   Glucose-Capillary 140 (H) 70 - 99 mg/dL  Lactic acid, plasma     Status: Abnormal   Collection Time: 06/14/19 10:53 PM  Result Value Ref Range   Lactic Acid, Venous 3.2 (HH) 0.5 - 1.9 mmol/L    Comment: CRITICAL RESULT CALLED TO, READ BACK BY AND VERIFIED WITH: WILLIS,S RN 06/14/2019 2342 JORDANS Performed at [facility], [place] [address], [place], [place] [address]   CBC     Status: Abnormal   Collection Time: 06/14/19 10:53 PM  Result Value Ref Range   WBC 7.1 4.0 - 10.5 K/uL   RBC 4.01 3.87 - 5.11 MIL/uL   Hemoglobin 10.3 (L) 12.0 - 15.0 g/dL   HCT 32.3 (L) 36.0 - 46.0 %   MCV 80.5 80.0 - 100.0 fL   MCH 25.7 (L) 26.0 - 34.0 pg   MCHC 31.9 30.0 - 36.0 g/dL   RDW 16.5 (H) 11.5 - 15.5 %   Platelets 226 150 - 400 K/uL   nRBC 2.4 (H) 0.0 - 0.2 %    Comment: Performed at [facility] [address] [place], [place] [address]  Glucose, capillary     Status: Abnormal   Collection Time: 06/14/19 11:47 PM  Result Value Ref Range   Glucose-Capillary 168 (H) 70 - 99 mg/dL  Glucose, capillary     Status: Abnormal   Collection Time: 06/15/19 12:51 AM  Result Value Ref Range   Glucose-Capillary 176 (H) 70 - 99 mg/dL  Glucose, capillary     Status: Abnormal   Collection Time: 06/15/19  1:54 AM  Result Value Ref Range   Glucose-Capillary 204 (H) 70 - 99 mg/dL  Glucose, capillary     Status: Abnormal   Collection Time: 06/15/19  2:45 AM  Result Value Ref Range   Glucose-Capillary 197 (H) 70 - 99 mg/dL  Glucose, capillary     Status: Abnormal   Collection Time: 06/15/19  3:47 AM  Result Value Ref Range   Glucose-Capillary 183 (H) 70 - 99 mg/dL  Glucose, capillary     Status: Abnormal   Collection Time: 06/15/19  4:57 AM  Result Value Ref Range   Glucose-Capillary 198 (H) 70 - 99 mg/dL  Hepatic function panel     Status: Abnormal   Collection Time: 06/15/19  5:09 AM  Result Value Ref Range   Total Protein 4.9 (L) 6.5 - 8.1 g/dL  Albumin 1.7 (L) 3.5 - 5.0 g/dL   AST >10,000 (H) 15 - 41 U/L    Comment: RESULTS CONFIRMED BY MANUAL DILUTION   ALT 4,740 (H) 0 - 44 U/L    Comment: RESULTS CONFIRMED BY MANUAL DILUTION   Alkaline Phosphatase 154 (H) 38 - 126 U/L   Total Bilirubin 0.9 0.3 - 1.2 mg/dL   Bilirubin, Direct 0.3 (H) 0.0 - 0.2 mg/dL   Indirect Bilirubin 0.6 0.3 - 0.9 mg/dL    Comment: Performed at [facility] [address], [place], [place] [address]  Protime-INR     Status: Abnormal   Collection Time: 06/15/19  5:09 AM  Result Value Ref Range   Prothrombin Time 17.6 (H) 11.4 - 15.2 seconds   INR 1.5 (H) 0.8 - 1.2    Comment: (NOTE) INR goal varies based on device and disease states. Performed at [facility], [place] [address] [place], [place] [address]   Lactic acid, plasma     Status: Abnormal   Collection Time: 06/15/19  5:09 AM  Result Value Ref Range   Lactic Acid, Venous 2.9 (HH) 0.5 - 1.9 mmol/L    Comment: CRITICAL RESULT CALLED TO, READ BACK BY AND VERIFIED WITH: Colleen Can [id number] [id number] Performed at [facility], [place] [address] [place], [place] [address]   I-STAT 7, (LYTES, BLD GAS, ICA, H+H)     Status: Abnormal   Collection Time: 06/15/19  5:32 AM  Result Value Ref Range   pH, Arterial 7.171 (LL) 7.350 - 7.450   pCO2 arterial 26.4 (L) 32.0 - 48.0 mmHg   pO2, Arterial 71.0 (L) 83.0 - 108.0 mmHg   Bicarbonate 9.6 (L) 20.0 - 28.0 mmol/L   TCO2 10 (L) 22 - 32 mmol/L   O2 Saturation 90.0 %   Acid-base deficit 17.0 (H) 0.0 - 2.0 mmol/L   Sodium 143 135 - 145 mmol/L   Potassium 4.4 3.5 - 5.1 mmol/L   Calcium, Ion 1.11 (L) 1.15 - 1.40 mmol/L   HCT 27.0 (L) 36.0 - 46.0 %   Hemoglobin 9.2 (L) 12.0 - 15.0 g/dL   Patient temperature 37.1 C    Collection site RADIAL, ALLEN'S TEST ACCEPTABLE    Drawn by RT    Sample type ARTERIAL    Comment NOTIFIED PHYSICIAN   Glucose, capillary     Status: Abnormal   Collection Time: 06/15/19  6:04 AM  Result Value Ref Range    Glucose-Capillary 194 (H) 70 - 99 mg/dL  Glucose, capillary     Status: Abnormal   Collection Time: 06/15/19  6:55 AM  Result Value Ref Range   Glucose-Capillary 192 (H) 70 - 99 mg/dL  Renal function panel     Status: Abnormal   Collection Time: 06/15/19  7:34 AM  Result Value Ref Range   Sodium 150 (H) 135 - 145 mmol/L   Potassium 4.8 3.5 - 5.1 mmol/L   Chloride 104 98 - 111 mmol/L   CO2 13 (L) 22 - 32 mmol/L   Glucose, Bld 233 (H) 70 - 99 mg/dL   BUN 67 (H) 8 - 23 mg/dL   Creatinine, Ser 5.05 (H) 0.44 - 1.00 mg/dL   Calcium 8.2 (L) 8.9 - 10.3 mg/dL   Phosphorus 6.2 (H) 2.5 - 4.6 mg/dL   Albumin 1.6 (L) 3.5 - 5.0 g/dL   GFR calc non Af Amer 9 (L) >60 mL/min   GFR calc Af Amer 10 (L) >60 mL/min   Anion  gap 33 (H) 5 - 15    Comment: Performed at [facility], [place] [address], [place], [place] [address]  Ammonia     Status: Abnormal   Collection Time: 06/15/19  7:34 AM  Result Value Ref Range   Ammonia 75 (H) 9 - 35 umol/L    Comment: Performed at [facility], [place] [address], [place], [place] [address]  Glucose, capillary     Status: Abnormal   Collection Time: 06/15/19  7:44 AM  Result Value Ref Range   Glucose-Capillary 193 (H) 70 - 99 mg/dL    Dg Abd 1 View  Result Date: 06/13/2019 CLINICAL DATA:  NG tube placement. EXAM: ABDOMEN - 1 VIEW COMPARISON:  None. FINDINGS: The bowel gas pattern is normal. No radio-opaque calculi or other significant radiographic abnormality are seen. Enteric catheter terminates in the expected location of gastric antrum. IMPRESSION: Enteric catheter terminates in the expected location of gastric antrum. Electronically Signed   By: Fidela Salisbury M.D.   On: 06/13/2019 11:11   Dg Chest Port 1 View  Result Date: 06/14/2019 CLINICAL DATA:  Fevers EXAM: PORTABLE CHEST 1 VIEW COMPARISON:  06/13/2019 FINDINGS: Cardiac shadow is mildly enlarged but stable. Endotracheal tube and nasogastric catheter are seen in satisfactory position. The  lungs are well aerated bilaterally. Patchy infiltrate in the left base is noted. This is slightly increased when compared with the prior exam. IMPRESSION: Increasing left basilar infiltrate. Tubes and lines as described. Electronically Signed   By: Inez Catalina M.D.   On: 06/14/2019 22:47   Dg Chest Port 1 View  Result Date: 06/13/2019 CLINICAL DATA:  Acute respiratory failure. Endotracheal tube placement. EXAM: PORTABLE CHEST 1 VIEW COMPARISON:  None. FINDINGS: Endotracheal tube is 1.6 cm above the carina. Nasogastric tube extends into the abdomen but the tip is beyond the image. Low lung volumes. Few patchy densities in the right lung could represent atelectasis and/or overlying structures. Negative for a large pneumothorax. Heart size is upper limits of normal but likely accentuated due to the low lung volumes. IMPRESSION: 1. Endotracheal tube is appropriately positioned. 2. Nasogastric tube extends into the abdomen. 3. Low lung volumes. Electronically Signed   By: Markus Daft M.D.   On: 06/13/2019 10:51    Assessment/Plan  **Acute liver injury: secondary to APAP toxicity, NAC per poison control.  Care per primary and GI who will be consulting.     **AKI:  Presumably ATN in setting of shock.  Likely with mild underlying CKD due to DM.  Renal US pending.  Check UA.  In light of trajectory with worsening renal function, dec UOP, now hypotension and worsening acidosis she would benefit from initiation of CRRT.  Have discussed with PCCM - no emergency to start, but in next 24h would like to initiate when catheter able to be placed assuming repeat blood gas has improving acidosis on bicarb gtt.  If not improving would be more urgent.   **Acidosis:  AGMA from lactic acidosis + renal failure.  Agree with bicarbonate gtt for now. Once CRRT initiated can discontinue bicarb gtt.   **Hypernatremia: FWF 300 q4H initiated this AM.   **Acute hypoxic respiratory failure: CXR with LLL opacity c/w aspiration.   Currently on vent with improving oxygenation but still on 50% FiO2.   **DM complicated by HHS on presentation:  On insulin gtt with improved BG.  Per primary. A1c was 12.9 in 04/2018 at [facility], no others for review.   **AMS: no acute issue  on CT or EEG.  Encephalopathy likely multifactorial.  Per Primary.  **Seizure DO:  On keppra; dosing per Pharmacy when CRRT initiated.   Justin Mend 06/15/2019, 8:24 AM

## 2019-06-15 NOTE — Progress Notes (Signed)
Pharmacy Antibiotic Note  Melissa Foley is a 63 y.o. female admitted on 06/22/2019 with pneumonia. Pharmacy has been consulted for Zosyn dosing.  WBC now down to 7.1. Tmax 101.5. Patient is currently in renal and liver failure. Placing HD catheter this PM to start CRRT.   Plan: Adjust Zosyn to 3.375g IV every 6 hours while on CRRT starting with 2300PM dose.  Monitor renal function, clinical signs of infection Follow-up LOT  Height: 5\' 7"  (170.2 cm) Weight: 285 lb 15 oz (129.7 kg) IBW/kg (Calculated) : 61.6  Temp (24hrs), Avg:100.3 F (37.9 C), Min:98.6 F (37 C), Max:101.5 F (38.6 C)  Recent Labs  Lab 06/28/2019 2213  06/13/19 0259 06/13/19 0449  06/14/19 1004 06/14/19 1303 06/14/19 1711 06/14/19 2040 06/14/19 2253 06/15/19 0509 06/15/19 0734  WBC 16.3*  --   --  16.4*  --   --   --   --   --  7.1  --   --   CREATININE 2.62*   < >  --  2.50*   < > 3.54* 3.77* 4.02* 4.24*  --   --  5.05*  LATICACIDVEN 4.8*  --  1.6  --   --   --   --   --   --  3.2* 2.9*  --    < > = values in this interval not displayed.    Estimated Creatinine Clearance: 16.4 mL/min (A) (by C-G formula based on SCr of 5.05 mg/dL (H)).    No Known Allergies  Antimicrobials this admission: Zosyn 7/12 >>   Dose adjustments this admission: None  Microbiology results: 7/12 BCx: Ordered 7/10 MRSA PCR: neg  Thank you for allowing pharmacy to be a part of this patient's care.  Sloan Leiter, PharmD, BCPS, BCCCP Clinical Pharmacist Please refer to [facility] for [place] numbers 06/15/2019 2:25 PM

## 2019-06-15 NOTE — Progress Notes (Signed)
25 ml of versed gtt wasted w/ Durwin Nora.

## 2019-06-15 NOTE — Progress Notes (Signed)
Attempted to stick ABG 2X unsuccessfully.

## 2019-06-15 NOTE — Progress Notes (Addendum)
NAME:  Melissa Foley, MRN:  [id number], DOB:  11/20/1956, LOS: 3 ADMISSION DATE:  06/28/2019, CONSULTATION DATE:  06/22/2019 REFERRING MD:  [facility], CHIEF COMPLAINT:  AMS  Brief History   63 yo F found unresponsive by neighbor, seizing in ED, found to have Acetaminophen toxicity and Glu > 700. Intubated and has R fem CVC  History of present illness   63 yo F PMH DM, HTN, COPD, CKD III, CVAs, Seizure Disorder, who presents to [facility] ED via EMS for AMS. Patient was found unresponsive on her couch by a neighbor who then dispatched EMS. Per EMS, patient was exhibiting respiratory distress, as well as rhythmic eye movement. In ED, patient continues to be unresponsive. Patient noted to have SpO2 70s. No change in mental status with Narcan. Nasal trumpet placed and rhythmic facial and eye contractions noted and was thought to be actively seizing. Ativan was administered without resolution. Patient then intubated. Patient noted to have glucose >900 without ketones in urine, found to have acetaminophen level 44, transaminitis, ProBNP >1000.  CT scan report states no acute intracranial abnormality.   Patient started on insulin gtt and NAC   Past Medical History  CKD III HTN HLD DM CVA Seizure Disorder COPD  Significant Hospital Events   7/10 intubated at [facility], CVC placed, [facility]  7/11 No overnight events No seizures noted overnight  Consults:  Nephrology  Gastreoenterology  Procedures:  7/10 ETT>> 7/10 R Fem CVC>>   Significant Diagnostic Tests:  7/10 CT Head non-con [facility]sey) read: no acute intracranial abnormality 7/10 CXR [facility] Linsey) read: no cardiomegaly, mild bibasilar atelectasis, no pulmonary edema   Micro Data:  7/10 SARS CoV2> negative (Kirk performed)  7/12 BC pending   Antimicrobials:  7/12 Zosyn >   Interim history/subjective:  Continues on NAC infusion.  Heavily sedated. Hypotension this AM  Objective   Blood pressure (!) 93/47, pulse  (!) 108, temperature 98.6 F (37 C), resp. rate (!) 27, height 5\' 7"  (1.702 m), weight 129.7 kg, SpO2 90 %.    Vent Mode: PRVC FiO2 (%):  [40 %-70 %] 50 % Set Rate:  [16 bmp] 16 bmp Vt Set:  [450 mL] 450 mL PEEP:  [5 cmH20] 5 cmH20   Intake/Output Summary (Last 24 hours) at 06/15/2019 0737 Last data filed at 06/15/2019 0600 Gross per 24 hour  Intake 4946.58 ml  Output 675 ml  Net 4271.58 ml   Filed Weights   06/13/19 0500 06/14/19 0500 06/15/19 0500  Weight: 124.8 kg 125.8 kg 129.7 kg   Examination: Critically ill, morbidly obese, minimal response to painful stimuli. Moist oral mucosa Trachea midline, ETT and OGT in place with no pressure ulceration.  S1-S2 appreciated, extremities warm.  Tachypnea with diffuse rhonchi. Abdomen is soft, bowel sounds appreciated Agitated moving around in bed. Not following commands and not orienting to voice.  Resolved Hospital Problem list     Assessment & Plan:   Critically ill due to acute hypoxic respiratory failure requiring mechanical ventilation. - CXR 7/12 c/w with probable LLL aspiration P:  Continue full MV support Trend ABG/ CXR VAP bundle Continue zosyn day day 2/x Daily SBT if meets criteria   Acetaminophen toxicity with worsening transaminitis - Unclear whether this was an intentional overdose P:  Consult GI  Continue NAC-acetaminophen given ongoing, worsening renal function Trend LFTs/ INR (Follow INR as marker of hepatic function- trend has been stable despite worsening AST/ ALT) Add lactulose  Hyperosmolar Hyperglycemic state without ketosis.  P:  -Continue  IV insulin.  -Given rapidly changing condition would be slow to transition to [place] insulin.  AKI on CKD 3 with oliguria Worsening metabolic acidosis P:  Continue bicarb gtt Pending BMP Renal US Consult nephrology, will likely need CRRT Trend renal/ mag/ strict I/Os / urinary output  Critically ill due to toxic metabolic encephalopathy requiring  re-initiation of sedative infusions. -Combination of HHS and acetaminophen overdose P:  PAD protocol with fentanyl RASS goal 0/-1 Discontinue Versed. Check ammonia  Ongoing neuro exams  Seizure Disorder  -Per [facility] chart, actively seizing on presentation. Report of features is variable.  - 7/11 EEG neg for epileptiform activity, c/w generalized nonspecific cerebral dysfunction.  P:  Continue keppra Seizure precautions   Possible congestive heart failure Elevated BNP but relatively normal Echo 7/11 - Follow clinically  Best practice:  Diet: Start tube feeds per protocol Pain/Anxiety/Delirium protocol (if indicated): fentanyl infusion and midazolam infusion VAP protocol (if indicated): Yes DVT prophylaxis: SQ heparin GI prophylaxis: Protonix Glucose control: insulin gtt Mobility: BR Code Status: Full  Family Communication: Pending Disposition: Admit to ICU   Labs   CBC: Recent Labs  Lab 06/28/2019 2213 06/16/2019 2221 06/13/19 0449 06/14/19 1157 06/14/19 2253 06/15/19 0532  WBC 16.3*  --  16.4*  --  7.1  --   HGB 11.9* 11.9* 11.8* 10.2* 10.3* 9.2*  HCT 37.3 35.0* 36.5 30.0* 32.3* 27.0*  MCV 79.7*  --  80.4  --  80.5  --   PLT 292  --  263  --  226  --    Basic Metabolic Panel: Recent Labs  Lab 06/13/19 0449 06/13/19 1145 06/13/19 1700  06/14/19 0239 06/14/19 1004 06/14/19 1157 06/14/19 1303 06/14/19 1711 06/14/19 2040 06/15/19 0532  NA 143 143 143   < > 143 146* 142 146* 146* 149* 143  K 3.0* 3.3* 3.4*   < > 3.4* 3.5 3.1* 4.0 3.9 4.0 4.4  CL 103 103 104   < > 102 102  --  103 102 105  --   CO2 22 21* 20*   < > 18* 16*  --  16* 15* 13*  --   GLUCOSE 241* 160* 171*   < > 271* 196*  --  179* 140* 130*  --   BUN 35* 36* 39*   < > 48* 53*  --  56* 57* 61*  --   CREATININE 2.50* 2.41* 2.83*   < > 3.12* 3.54*  --  3.77* 4.02* 4.24*  --   CALCIUM 7.9* 8.2* 8.3*   < > 8.2* 8.6*  --  8.4* 8.5* 8.3*  --   MG 2.2 2.1 2.1  --  2.2  --   --   --  2.5*  --   --    PHOS 4.3 3.1 3.3  --  4.2  --   --   --  5.8*  --   --    < > = values in this interval not displayed.   GFR: Estimated Creatinine Clearance: 19.5 mL/min (A) (by C-G formula based on SCr of 4.24 mg/dL (H)). Recent Labs  Lab 06/18/2019 2213 06/13/19 0259 06/13/19 0449 06/14/19 2253 06/15/19 0509  WBC 16.3*  --  16.4* 7.1  --   LATICACIDVEN 4.8* 1.6  --  3.2* 2.9*    Liver Function Tests: Recent Labs  Lab 06/21/2019 2213 06/13/19 0449 06/14/19 0239 06/15/19 0509  AST 555* 599* 3,362* >10,000*  ALT 314* 358* 1,705* 4,740*  ALKPHOS 119 116 99 154*  BILITOT  0.9 0.8 0.7 0.9  PROT 6.0* 5.6* 4.9* 4.9*  ALBUMIN 2.7* 2.6* 2.0* 1.7*   No results for input(s): LIPASE, AMYLASE in the last 168 hours. No results for input(s): AMMONIA in the last 168 hours.  ABG    Component Value Date/Time   PHART 7.171 (LL) 06/15/2019 0532   PCO2ART 26.4 (L) 06/15/2019 0532   PO2ART 71.0 (L) 06/15/2019 0532   HCO3 9.6 (L) 06/15/2019 0532   TCO2 10 (L) 06/15/2019 0532   ACIDBASEDEF 17.0 (H) 06/15/2019 0532   O2SAT 90.0 06/15/2019 0532    Coagulation Profile: Recent Labs  Lab 06/15/2019 2232 06/13/19 0242 06/13/19 0449 06/14/19 1004 06/15/19 0509  INR 1.4* 1.5* 1.4* 1.7* 1.5*   Cardiac Enzymes: Recent Labs  Lab 06/30/2019 2213  CKTOTAL 66   HbA1C: No results found for: HGBA1C  CBG: Recent Labs  Lab 06/15/19 0245 06/15/19 0347 06/15/19 0457 06/15/19 0604 06/15/19 0655  GLUCAP 197* 183* 198* 194* 192*   Ina Homes, MD IMTS PGY3   Attending Note:  63 year old female with PMH of heart failure but no other known disease who was found down by her neighbor and EMS was called.  Overnight, worsening hypotension and remains unresponsive.  On exam, unresponsive, not following commands with clear lungs.  I reviewed CXR myself, ETT is in a good position.  Discussed with bedside RN and resident.  Will continue full vent support.  Worsening acidosis and renal function, low UOP.  Will  consult renal to evaluate.  Continue NAC until ALT is normal.  Tylenol level <10.  GI consult to evaluate when it is appropriate to consider transplant.  Start lactulose and recheck ammonia level in AM.  PCCM will continue to manage for now.  The patient is critically ill with multiple organ systems failure and requires high complexity decision making for assessment and support, frequent evaluation and titration of therapies, application of advanced monitoring technologies and extensive interpretation of multiple databases.   Critical Care Time devoted to patient care services described in this note is  61  Minutes. This time reflects time of care of this signee Dr [name]. This critical care time does not reflect procedure time, or teaching time or supervisory time of PA/NP/Med student/Med Resident etc but could involve care discussion time.  Rush Farmer, M.D. [facility]monary/Critical Care Medicine. Pager: [phone]. After hours pager: [phone].

## 2019-06-15 NOTE — Progress Notes (Signed)
Have spoken to poison control this am.. recommend continuing acetylcysteine gtt x 24 more hours. MD informed. Caryl Pina, daughter, called for consent for HD cath and updated. Emotional support provided.

## 2019-06-15 NOTE — Procedures (Signed)
Central Venous Trialysis Catheter Insertion Procedure Note Melissa Foley [id number]  10-01-1956  Procedure: Insertion of Central Venous Catheter Indications: Dialysis access  Procedure Details Consent: Risks of procedure as well as the alternatives and risks of each were explained to the (patient/caregiver).  Consent for procedure obtained. Time Out: Verified patient identification, verified procedure, site/side was marked, verified correct patient position, special equipment/implants available, medications/allergies/relevent history reviewed, required imaging and test results available.  Performed  Maximum sterile technique was used including antiseptics, cap, gloves, gown, hand hygiene, mask and sheet. Skin prep: Chlorhexidine; local anesthetic administered A antimicrobial bonded/coated triple lumen catheter was placed in the right internal jugular vein using the Seldinger technique.  U/S used in placement  Resident Melissa Homes, MD as first assist  Evaluation Blood flow good Complications: No apparent complications Patient did tolerate procedure well. Chest X-ray ordered to verify placement.  CXR: pending.  Melissa Foley 06/15/2019, 2:43 PM

## 2019-06-15 NOTE — Progress Notes (Addendum)
[place] Progress Note Patient Name: Melissa Foley DOB: [birth year] MRN: [id number]   Date of Service  06/15/2019  HPI/Events of Note  Patient's BP improved with fluid boluses however ABG 7.17/26/71  eICU Interventions  Will switch normal saline to bicarb drip. Remaining labs still pending but I suspect worsening of renal function in combination with lactic acidosis. Dialysis may be needed if acidosis intractable.     Intervention Category Major Interventions: Acid-Base disturbance - evaluation and management  Judd Lien 06/15/2019, 5:53 AM

## 2019-06-15 NOTE — Progress Notes (Signed)
Gluco stabilizer indicated to place insulin gtt on hold. Melissa Sage, MD notified. Was advised to continue checking blood sugars every hour, utilize the gluco stabilizer and wait for the program to prompt restarting insulin gtt. No new orders at this time. Will continue to monitor.

## 2019-06-15 NOTE — Progress Notes (Signed)
CRITICAL VALUE ALERT  Critical Value:  Lactic acid 2.9  Date & Time Notied:  06/15/2019  0610  Provider Notified: Warren Lacy  Orders Received/Actions taken:

## 2019-06-15 NOTE — Consult Note (Addendum)
[facility] Consult: 9:59 AM 06/15/2019  LOS: 3 days    Referring Provider: Dr Nelda Marseille  Primary Care Physician:  Patient, No Pcp Per Primary Gastroenterologist:  Dr Acquanetta Sit  In [facility]    Reason for Consultation:  Acute liver injury in pt after APAP overdose.     HPI: Melissa Foley is a 63 y.o. female.  From [facility]  Hx seizures.  Poorly controlled IDDM.  COPD.  CVA.  CKD 3.  Morbid obesity.  Chronic pain syndrome.  Anxiety, depression. S/p cholecystectomy.  04/2018 Colonoscopy with polypectomy.  04/2018 EGD.  Both studies pursued during admission to [facility] hospital with nausea, vomiting, abdominal pain, chronic C. difficile negative diarrhea, hypovolemic hyponatremia, hyperosmolar hyperglycemic state.  Unable to retrieve actual procedural reports. CT at that time showed mild fat stranding around the pancreas, concerning for acute pancreatitis.  Ultrasound at the time showed postcholecystectomy status.  5.1 mm CBD.  Fatty liver. Patient's symptoms felt due to reflux esophagitis as well as functional etiology.  Discovered unresponsive by a neighbor yesterday.  Respiratory distress per EMS.  No improvement after Narcan.  Facial and eye contractions thought likely due to seizure activity, not response to Ativan.  Patient bagged during transport and intubated in the ED.Marland Kitchen  Glucose greater than 900s.  Acetaminophen level 44.  Elevated LFTs.  AKI with oliguria. Initiated on insulin GTT and acetylcysteine.   Slightly elevated PT/INR, improved within the last 24 hours. T bili 0.7  >> 2.9.  Alkaline phosphatase 99  >> 154.   AST/ALT 3362/1705 to >10,000/4740.  Albumin 1.6. Ammonia level 75. Renal ultrasound shows no problems with the kidney.  There is hepatic steatosis. CXR with increasing  left basilar infiltrate. Echocardiogram with LVEF of 55-60%.  Otherwise unremarkable. Acetylcysteine ongoing along with precedex, insulin Tube feeding in place.       Past medical and surgical hx: found in notes.    See first paragraph above.    Past Medical History:  Diagnosis Date  . Adenomatous colon polyps   . Anxiety   . C. difficile colitis   . Chronic pain   . CKD (chronic kidney disease)   . COPD (chronic obstructive pulmonary disease) ([place])   . Depression   . NAFLD (nonalcoholic fatty liver disease)   . Obesity   . Seizure disorder ([place])   . Stroke ([place])   . Uncontrolled type 2 diabetes mellitus ([place])    Past Surgical History:  Procedure Laterality Date  . CHOLECYSTECTOMY    . COLONOSCOPY WITH ESOPHAGOGASTRODUODENOSCOPY (EGD)  2020     Prior to Admission medications   Medication Sig Start Date End Date Taking? Authorizing Provider  Acetaminophen (TYLENOL PO) Take by mouth.   Yes [provider]  amoxicillin (AMOXIL) 500 MG capsule Take 500 mg by mouth 3 (three) times daily.   Yes [provider]  bisoprolol (ZEBETA) 10 MG tablet Take 10 mg by mouth daily.   Yes [provider]  BREO ELLIPTA 100-25 MCG/INH AEPB Inhale 1 puff into the lungs daily.  02/27/19  Yes [provider]  busPIRone (BUSPAR) 5 MG tablet Take 10 mg by mouth 2 (two) times daily.    Yes [provider]  diclofenac (VOLTAREN) 50 MG EC tablet Take 50 mg by mouth daily.   Yes [provider]  Dulaglutide (TRULICITY) 4.09 WJ/1.9JY SOPN Inject 1 Dose into the skin once a week.   Yes [provider]  DULoxetine (CYMBALTA) 60 MG capsule Take 120 mg by mouth daily.   Yes [provider]  fenofibrate (TRICOR) 145 MG tablet Take 145 mg by mouth daily.    Yes [provider]  Gabapentin, Once-Daily, (GRALISE) 300 MG TABS Take 300 mg by mouth at bedtime.   Yes [provider]  insulin aspart (NOVOLOG FLEXPEN) 100 UNIT/ML  FlexPen Inject 25-30 Units into the skin 2 (two) times daily. 30 units in the morning and 25 in the evening   Yes [provider]  lamoTRIgine 50 MG TBDP Take 50 mg by mouth daily.   Yes [provider]  mirabegron ER (MYRBETRIQ) 25 MG TB24 tablet Take 25 mg by mouth daily.   Yes [provider]  pantoprazole (PROTONIX) 40 MG tablet Take 40 mg by mouth daily. 04/23/18  Yes [provider]  Probiotic Product (ALIGN) 4 MG CAPS Take 4 mg by mouth daily.   Yes [provider]  QUEtiapine (SEROQUEL) 100 MG tablet Take 100 mg by mouth at bedtime.   Yes [provider]  rosuvastatin (CRESTOR) 20 MG tablet Take 20 mg by mouth at bedtime.   Yes [provider]  sodium chloride 1 g tablet Take 1 g by mouth 3 (three) times daily with meals.   Yes [provider]  terazosin (HYTRIN) 1 MG capsule Take 1 mg by mouth at bedtime.   Yes [provider]  traZODone (DESYREL) 50 MG tablet Take 50 mg by mouth at bedtime.    Yes [provider]    Scheduled Meds: . chlorhexidine gluconate (MEDLINE KIT)  15 mL Mouth Rinse BID  . Chlorhexidine Gluconate Cloth  6 each Topical Daily  . feeding supplement (PRO-STAT SUGAR FREE 64)  30 mL Per Tube BID  . feeding supplement (VITAL HIGH PROTEIN)  1,000 mL Per Tube Q24H  . free water  300 mL Per Tube Q4H  . heparin  5,000 Units Subcutaneous Q8H  . lactulose  20 g Oral TID  . mouth rinse  15 mL Mouth Rinse 10 times per day  . pantoprazole (PROTONIX) IV  40 mg Intravenous QHS  . potassium chloride  40 mEq Oral BID  . sennosides  10 mL Per Tube BID  . sodium chloride flush  10-40 mL Intracatheter Q12H   Infusions: . acetylcysteine 15 mg/kg/hr (06/15/19 0927)  . dexmedetomidine (PRECEDEX) IV infusion    . fentaNYL infusion INTRAVENOUS 150 mcg/hr (06/15/19 0800)  . insulin 11.4 mL/hr at 06/15/19 0800  . levETIRAcetam Stopped (06/14/19 2240)  . norepinephrine (LEVOPHED) Adult infusion  5 mcg/min (06/15/19 0914)  . piperacillin-tazobactam (ZOSYN)  IV Stopped (06/15/19 0554)  .  sodium bicarbonate (isotonic) infusion in sterile water 75 mL/hr at 06/15/19 0800   PRN Meds: albuterol, bisacodyl, fentaNYL (SUBLIMAZE) injection, fentaNYL (SUBLIMAZE) injection, ondansetron (ZOFRAN) IV, sodium chloride flush   Allergies as of 06/29/2019  . (No Known Allergies)    FHx not obtainable    REVIEW OF SYSTEMS: Unable to obtain a review of systems as patient is sedated on the vent and was obtunded at arrival and not able  to provide any history.   PHYSICAL EXAM: Vital signs in last 24 hours: Vitals:   06/15/19 0845 06/15/19 0900  BP: (!) 83/40 (!) 107/45  Pulse: (!) 107 (!) 111  Resp: (!) 27 (!) 26  Temp:    SpO2: 91% 93%   Wt Readings from Last 3 Encounters:  06/15/19 129.7 kg    General: Morbidly obese WF.  On vent.  Multiple IV drips in place. Head: No signs of head trauma.  No facial asymmetry.  ET tube in place. Eyes: No scleral icterus.  No conjunctival pallor.  EOMI. And PERRLA Ears: Unable to assess hearing. Nose: NG tube in place.   Mouth:  ETT in place.  No blood at the mouth. Neck: No JVD, no masses. Lungs: Clear bilaterally, unlabored breathing on vent. Heart: Slightly tachycardic in the low 100s.  Regular rhythm.  No MRG. Abdomen: Obese.  No HSM, masses, hernias, bruits.  Bowel sounds normal but hypoactive..   Rectal: Flexi-Seal catheter in place, about 150 cc of caf au lait colored brown, liquid stool present. Musc/Skeltl: No obvious joint deformities, redness or swelling. Extremities: No CCE.  Feet a bit cool but capillary refill brisk. Neurologic: Unresponsive, on Precedex. Skin: A few bruises on her upper left arm.  No rash, telangiectasia. Tattoos: Tattoo just above the left breast, quality poor/? amateur   Intake/Output from previous day: 07/12 0701 - 07/13 0700 In: 5053.6 [I.V.:2552.1; NG/GT:1100; IV Piggyback:1401.5] Out: 675 [Urine:675]  Intake/Output this shift: Total I/O In: 140.4 [I.V.:140.4] Out: -   LAB RESULTS: Recent Labs    06/28/2019 2213  06/13/19 0449 06/14/19 1157 06/14/19 2253 06/15/19 0532  WBC 16.3*  --  16.4*  --  7.1  --   HGB 11.9*   < > 11.8* 10.2* 10.3* 9.2*  HCT 37.3   < > 36.5 30.0* 32.3* 27.0*  PLT 292  --  263  --  226  --    < > = values in this interval not displayed.   BMET Lab Results  Component Value Date   NA 150 (H) 06/15/2019   NA 143 06/15/2019   NA 149 (H) 06/14/2019   K 4.8 06/15/2019   K 4.4 06/15/2019   K 4.0 06/14/2019   CL 104 06/15/2019   CL 105 06/14/2019   CL 102 06/14/2019   CO2 13 (L) 06/15/2019   CO2 13 (L) 06/14/2019   CO2 15 (L) 06/14/2019   GLUCOSE 233 (H) 06/15/2019   GLUCOSE 130 (H) 06/14/2019   GLUCOSE 140 (H) 06/14/2019   BUN 67 (H) 06/15/2019   BUN 61 (H) 06/14/2019   BUN 57 (H) 06/14/2019   CREATININE 5.05 (H) 06/15/2019   CREATININE 4.24 (H) 06/14/2019   CREATININE 4.02 (H) 06/14/2019   CALCIUM 8.2 (L) 06/15/2019   CALCIUM 8.3 (L) 06/14/2019   CALCIUM 8.5 (L) 06/14/2019   LFT Recent Labs    06/13/19 0449 06/14/19 0239 06/15/19 0509 06/15/19 0734  PROT 5.6* 4.9* 4.9*  --   ALBUMIN 2.6* 2.0* 1.7* 1.6*  AST 599* 3,362* >10,000*  --   ALT 358* 1,705* 4,740*  --   ALKPHOS 116 99 154*  --   BILITOT 0.8 0.7 0.9  --   BILIDIR 0.1 0.2 0.3*  --   IBILI 0.7 0.5 0.6  --    PT/INR Lab Results  Component Value Date   INR 1.5 (H) 06/15/2019   INR 1.7 (H) 06/14/2019   INR 1.4 (H) 06/13/2019   Hepatitis Panel No results for  input(s): HEPBSAG, HCVAB, HEPAIGM, HEPBIGM in the last 72 hours. C-Diff No components found for: CDIFF Lipase  No results found for: LIPASE  Drugs of Abuse  No results found for: LABOPIA, COCAINSCRNUR, LABBENZ, AMPHETMU, THCU, LABBARB   RADIOLOGY STUDIES: Dg Abd 1 View  Result Date: 06/13/2019 CLINICAL DATA:  NG tube placement. EXAM: ABDOMEN - 1 VIEW COMPARISON:  None. FINDINGS: The bowel gas pattern is normal.  No radio-opaque calculi or other significant radiographic abnormality are seen. Enteric catheter terminates in the expected location of gastric antrum. IMPRESSION: Enteric catheter terminates in the expected location of gastric antrum. Electronically Signed   By: Fidela Salisbury M.D.   On: 06/13/2019 11:11   Dg Chest Port 1 View  Result Date: 06/14/2019 CLINICAL DATA:  Fevers EXAM: PORTABLE CHEST 1 VIEW COMPARISON:  06/13/2019 FINDINGS: Cardiac shadow is mildly enlarged but stable. Endotracheal tube and nasogastric catheter are seen in satisfactory position. The lungs are well aerated bilaterally. Patchy infiltrate in the left base is noted. This is slightly increased when compared with the prior exam. IMPRESSION: Increasing left basilar infiltrate. Tubes and lines as described. Electronically Signed   By: Inez Catalina M.D.   On: 06/14/2019 22:47     IMPRESSION:   *    Acute liver injury in setting of Tylenol overdose.  Previous cholecystectomy.   Fatty liver seen on CT 2 months ago. APAP level now < 10.   LFTs rising, however the downward trend of PT/INR is reassuring.  *    AKI in setting of previous stage III CKD.  Oliguric.  May require CRRT.  *    Hyperosmolar hyperglycemic state.  Improved with insulin drip.  *    Encephalopathy.  Elevated ammonia to 75Remains on vent.  *     Left lung infiltrate.  Remains on vent.  *    Hypertriglyceridemia at 437.  *     Lactic acidosis, improved at 4.8>> 2.9  *    Normocytic anemia.  *    Mild coagulopathy, improved in last 24 hours.    PLAN:     *  Continue current Acetylcysteine.  Follow LFTs and pt/INR as doing  *   Per Dr. Carlean Purl  *   Ordered acute hepatitis serologies.   Azucena Freed  06/15/2019, 9:59 AM Phone [place] Attending   I have taken an interval history, reviewed the chart and examined the patient. I agree with the Advanced Practitioner's note, impression and recommendations.    The  patient is critically ill with acute liver failure and multi-system organ failure ( resp, kidney).  MELD is 24 but largely driven by her creatinine  She does not need referral to a liver transplant center as of today and her sig co-morbidities could preclude that - will follow closely as you provide excellent supportive care  Should INR start to increase sig my recommendation re: transplant eval could change   Gatha Mayer, MD, [facility] 06/15/2019 11:34 AM Pager [phone]

## 2019-06-15 NOTE — Progress Notes (Signed)
Inpatient Diabetes Program Recommendations  AACE/ADA: New Consensus Statement on Inpatient Glycemic Control (2015)  Target Ranges:  Prepandial:   less than 140 mg/dL      Peak postprandial:   less than 180 mg/dL (1-2 hours)      Critically ill patients:  140 - 180 mg/dL   Lab Results  Component Value Date   GLUCAP 164 (H) 06/15/2019    Review of Glycemic Control Results for Melissa Foley, Melissa Foley (MRN [id number]) as of 06/15/2019 10:53  Ref. Range 06/15/2019 06:55 06/15/2019 07:44 06/15/2019 09:06 06/15/2019 10:12  Glucose-Capillary Latest Ref Range: 70 - 99 mg/dL 192 (H) 193 (H) 130 (H) 164 (H)   Diabetes history: Type 2 DM Outpatient Diabetes medications: Trulicity 9.82 mg Q/wk, Novolog 30 units QAM, 25 units QPM Current orders for Inpatient glycemic control: IV insulin  Inpatient Diabetes Program Recommendations:    Last noted A1C was from 2019 and was >12%. Consider repeating A1C?  Also, IV insulin drip rates are exceeding 7 units/hr. Would continue with IV insulin.   Will follow.   Thanks, Bronson Curb, MSN, RNC-OB Diabetes Coordinator [phone] (8a-5p)

## 2019-06-15 NOTE — Progress Notes (Signed)
Was able to get ABG on patient as requested per order.  ABG results were as follows:  PH 7.171, CO2 26.4, PO2 71, HCO3 9.6.  These results were given to the patient's RN, Misty.  These results were also relayed to [place], RN in the box to give to the MD.  Will continue to monitor.

## 2019-06-15 NOTE — Procedures (Signed)
Arterial Catheter Insertion Procedure Note Melissa Foley [id number] 06/03/1956  Procedure: Insertion of Arterial Catheter  Indications: Blood pressure monitoring and Frequent blood sampling  Procedure Details Consent: Unable to obtain consent because of emergent medical necessity. Time Out: Verified patient identification, verified procedure, site/side was marked, verified correct patient position, special equipment/implants available, medications/allergies/relevent history reviewed, required imaging and test results available.  Performed  Maximum sterile technique was used including antiseptics, cap, gloves, gown, hand hygiene, mask and sheet. Skin prep: Chlorhexidine; local anesthetic administered 20 gauge catheter was inserted into right radial artery using the Seldinger technique. ULTRASOUND GUIDANCE USED: YES Evaluation Blood flow good; BP tracing good. Complications: No apparent complications.   Melissa Foley 06/15/2019

## 2019-06-16 ENCOUNTER — Inpatient Hospital Stay (HOSPITAL_COMMUNITY): Payer: Medicaid Other

## 2019-06-16 DIAGNOSIS — K7201 Acute and subacute hepatic failure with coma: Secondary | ICD-10-CM

## 2019-06-16 DIAGNOSIS — K760 Fatty (change of) liver, not elsewhere classified: Secondary | ICD-10-CM

## 2019-06-16 LAB — CBC
HCT: 31.3 % — ABNORMAL LOW (ref 36.0–46.0)
Hemoglobin: 10 g/dL — ABNORMAL LOW (ref 12.0–15.0)
MCH: 25.6 pg — ABNORMAL LOW (ref 26.0–34.0)
MCHC: 31.9 g/dL (ref 30.0–36.0)
MCV: 80.1 fL (ref 80.0–100.0)
Platelets: 199 10*3/uL (ref 150–400)
RBC: 3.91 MIL/uL (ref 3.87–5.11)
RDW: 17.3 % — ABNORMAL HIGH (ref 11.5–15.5)
WBC: 14.7 10*3/uL — ABNORMAL HIGH (ref 4.0–10.5)
nRBC: 2.9 % — ABNORMAL HIGH (ref 0.0–0.2)

## 2019-06-16 LAB — RENAL FUNCTION PANEL
Albumin: 1.4 g/dL — ABNORMAL LOW (ref 3.5–5.0)
Anion gap: 27 — ABNORMAL HIGH (ref 5–15)
BUN: 35 mg/dL — ABNORMAL HIGH (ref 8–23)
CO2: 11 mmol/L — ABNORMAL LOW (ref 22–32)
Calcium: 8.4 mg/dL — ABNORMAL LOW (ref 8.9–10.3)
Chloride: 102 mmol/L (ref 98–111)
Creatinine, Ser: 2.47 mg/dL — ABNORMAL HIGH (ref 0.44–1.00)
GFR calc Af Amer: 24 mL/min — ABNORMAL LOW (ref >60)
GFR calc non Af Amer: 20 mL/min — ABNORMAL LOW (ref >60)
Glucose, Bld: 202 mg/dL — ABNORMAL HIGH (ref 70–99)
Phosphorus: 5.5 mg/dL — ABNORMAL HIGH (ref 2.5–4.6)
Potassium: 4.8 mmol/L (ref 3.5–5.1)
Sodium: 140 mmol/L (ref 135–145)

## 2019-06-16 LAB — GLUCOSE, CAPILLARY
Glucose-Capillary: 143 mg/dL — ABNORMAL HIGH (ref 70–99)
Glucose-Capillary: 154 mg/dL — ABNORMAL HIGH (ref 70–99)
Glucose-Capillary: 160 mg/dL — ABNORMAL HIGH (ref 70–99)
Glucose-Capillary: 161 mg/dL — ABNORMAL HIGH (ref 70–99)
Glucose-Capillary: 164 mg/dL — ABNORMAL HIGH (ref 70–99)
Glucose-Capillary: 168 mg/dL — ABNORMAL HIGH (ref 70–99)
Glucose-Capillary: 168 mg/dL — ABNORMAL HIGH (ref 70–99)
Glucose-Capillary: 170 mg/dL — ABNORMAL HIGH (ref 70–99)
Glucose-Capillary: 178 mg/dL — ABNORMAL HIGH (ref 70–99)
Glucose-Capillary: 187 mg/dL — ABNORMAL HIGH (ref 70–99)
Glucose-Capillary: 195 mg/dL — ABNORMAL HIGH (ref 70–99)
Glucose-Capillary: 203 mg/dL — ABNORMAL HIGH (ref 70–99)

## 2019-06-16 LAB — POCT I-STAT 7, (LYTES, BLD GAS, ICA,H+H)
Acid-base deficit: 12 mmol/L — ABNORMAL HIGH (ref 0.0–2.0)
Bicarbonate: 13.3 mmol/L — ABNORMAL LOW (ref 20.0–28.0)
Calcium, Ion: 1.07 mmol/L — ABNORMAL LOW (ref 1.15–1.40)
HCT: 28 % — ABNORMAL LOW (ref 36.0–46.0)
Hemoglobin: 9.5 g/dL — ABNORMAL LOW (ref 12.0–15.0)
O2 Saturation: 99 %
Patient temperature: 97.7
Potassium: 4.3 mmol/L (ref 3.5–5.1)
Sodium: 138 mmol/L (ref 135–145)
TCO2: 14 mmol/L — ABNORMAL LOW (ref 22–32)
pCO2 arterial: 26.9 mmHg — ABNORMAL LOW (ref 32.0–48.0)
pH, Arterial: 7.301 — ABNORMAL LOW (ref 7.350–7.450)
pO2, Arterial: 131 mmHg — ABNORMAL HIGH (ref 83.0–108.0)

## 2019-06-16 LAB — COMPREHENSIVE METABOLIC PANEL
ALT: 2828 U/L — ABNORMAL HIGH (ref 0–44)
AST: 2150 U/L — ABNORMAL HIGH (ref 15–41)
Albumin: 1.5 g/dL — ABNORMAL LOW (ref 3.5–5.0)
Alkaline Phosphatase: 189 U/L — ABNORMAL HIGH (ref 38–126)
Anion gap: 28 — ABNORMAL HIGH (ref 5–15)
BUN: 47 mg/dL — ABNORMAL HIGH (ref 8–23)
CO2: 11 mmol/L — ABNORMAL LOW (ref 22–32)
Calcium: 8.3 mg/dL — ABNORMAL LOW (ref 8.9–10.3)
Chloride: 101 mmol/L (ref 98–111)
Creatinine, Ser: 3.28 mg/dL — ABNORMAL HIGH (ref 0.44–1.00)
GFR calc Af Amer: 17 mL/min — ABNORMAL LOW (ref >60)
GFR calc non Af Amer: 14 mL/min — ABNORMAL LOW (ref >60)
Glucose, Bld: 214 mg/dL — ABNORMAL HIGH (ref 70–99)
Potassium: 4.4 mmol/L (ref 3.5–5.1)
Sodium: 140 mmol/L (ref 135–145)
Total Bilirubin: 1.8 mg/dL — ABNORMAL HIGH (ref 0.3–1.2)
Total Protein: 5.2 g/dL — ABNORMAL LOW (ref 6.5–8.1)

## 2019-06-16 LAB — PHOSPHORUS: Phosphorus: 5.2 mg/dL — ABNORMAL HIGH (ref 2.5–4.6)

## 2019-06-16 LAB — PROTIME-INR
INR: 1.5 — ABNORMAL HIGH (ref 0.8–1.2)
Prothrombin Time: 17.5 seconds — ABNORMAL HIGH (ref 11.4–15.2)

## 2019-06-16 LAB — AMMONIA: Ammonia: 62 umol/L — ABNORMAL HIGH (ref 9–35)

## 2019-06-16 LAB — MAGNESIUM: Magnesium: 2.4 mg/dL (ref 1.7–2.4)

## 2019-06-16 MED ORDER — INSULIN GLARGINE 100 UNIT/ML ~~LOC~~ SOLN
20.0000 [IU] | Freq: Every day | SUBCUTANEOUS | Status: DC
  Administered 2019-06-16: 20 [IU] via SUBCUTANEOUS
  Filled 2019-06-16 (×2): qty 0.2

## 2019-06-16 MED ORDER — AMIODARONE HCL IN DEXTROSE 360-4.14 MG/200ML-% IV SOLN
30.0000 mg/h | INTRAVENOUS | Status: DC
  Administered 2019-06-16 (×2): 30 mg/h via INTRAVENOUS
  Filled 2019-06-16 (×2): qty 200

## 2019-06-16 MED ORDER — VITAL HIGH PROTEIN PO LIQD: 1000.0000 mL | ORAL | Status: DC

## 2019-06-16 MED ORDER — CHLORHEXIDINE GLUCONATE 0.12 % MT SOLN
OROMUCOSAL | Status: AC
  Filled 2019-06-16: qty 15

## 2019-06-16 MED ORDER — GERHARDT'S BUTT CREAM
TOPICAL_CREAM | Freq: Four times a day (QID) | CUTANEOUS | Status: DC
  Administered 2019-06-16 (×3): via TOPICAL
  Filled 2019-06-16: qty 1

## 2019-06-16 MED ORDER — CALCIUM GLUCONATE-NACL 1-0.675 GM/50ML-% IV SOLN
1.0000 g | Freq: Once | INTRAVENOUS | Status: AC
  Administered 2019-06-16: 10:00:00 1000 mg via INTRAVENOUS
  Filled 2019-06-16: qty 50

## 2019-06-16 MED ORDER — METOCLOPRAMIDE HCL 5 MG/ML IJ SOLN
5.0000 mg | Freq: Three times a day (TID) | INTRAMUSCULAR | Status: DC
  Administered 2019-06-16 – 2019-06-17 (×3): 5 mg via INTRAVENOUS
  Filled 2019-06-16 (×3): qty 2

## 2019-06-16 MED ORDER — AMIODARONE HCL IN DEXTROSE 360-4.14 MG/200ML-% IV SOLN
INTRAVENOUS | Status: AC
  Filled 2019-06-16: qty 200

## 2019-06-16 MED ORDER — FREE WATER: 200.0000 mL | Freq: Three times a day (TID) | Status: DC

## 2019-06-16 MED ORDER — LACTATED RINGERS IV BOLUS
500.0000 mL | Freq: Once | INTRAVENOUS | Status: AC
  Administered 2019-06-16: 500 mL via INTRAVENOUS

## 2019-06-16 MED ORDER — ALBUMIN HUMAN 5 % IV SOLN
25.0000 g | Freq: Once | INTRAVENOUS | Status: AC
  Administered 2019-06-16: 12.5 g via INTRAVENOUS
  Filled 2019-06-16: qty 250

## 2019-06-16 MED ORDER — AMIODARONE HCL IN DEXTROSE 360-4.14 MG/200ML-% IV SOLN
60.0000 mg/h | INTRAVENOUS | Status: AC
  Administered 2019-06-16 (×2): 60 mg/h via INTRAVENOUS

## 2019-06-16 MED ORDER — PANTOPRAZOLE SODIUM 40 MG PO PACK
40.0000 mg | PACK | Freq: Every day | ORAL | Status: DC
  Administered 2019-06-16: 40 mg
  Filled 2019-06-16 (×2): qty 20

## 2019-06-16 MED ORDER — FENTANYL CITRATE (PF) 100 MCG/2ML IJ SOLN
25.0000 ug | INTRAMUSCULAR | Status: DC | PRN
  Administered 2019-06-16: 10:00:00 100 ug via INTRAVENOUS
  Filled 2019-06-16: qty 2

## 2019-06-16 MED ORDER — VITAL HIGH PROTEIN PO LIQD
1000.0000 mL | ORAL | Status: DC
  Administered 2019-06-16: 1000 mL

## 2019-06-16 MED ORDER — AMIODARONE LOAD VIA INFUSION
150.0000 mg | Freq: Once | INTRAVENOUS | Status: AC
  Administered 2019-06-16: 150 mg via INTRAVENOUS

## 2019-06-16 MED ORDER — METOPROLOL TARTRATE 5 MG/5ML IV SOLN
INTRAVENOUS | Status: AC
  Filled 2019-06-16: qty 5

## 2019-06-16 MED ORDER — INSULIN ASPART 100 UNIT/ML ~~LOC~~ SOLN
0.0000 [IU] | SUBCUTANEOUS | Status: DC
  Administered 2019-06-16 (×3): 3 [IU] via SUBCUTANEOUS
  Administered 2019-06-16: 2 [IU] via SUBCUTANEOUS

## 2019-06-16 MED ORDER — METOPROLOL TARTRATE 5 MG/5ML IV SOLN
2.5000 mg | INTRAVENOUS | Status: DC | PRN
  Administered 2019-06-16: 5 mg via INTRAVENOUS

## 2019-06-16 NOTE — Progress Notes (Signed)
[facility] Progress Note Patient Name: Melissa Foley DOB: 25-Jul-1956 MRN: [id number]   Date of Service  06/16/2019  HPI/Events of Note  Patient went into afib RVR while being bathed with subsequent decrease in BP. Ordered a fluid bolus but converted to sinus even prior to giving the bolus.  eICU Interventions  Labs pending. Correct as necessary. Continue with the 500 cc bolus.     Intervention Category Major Interventions: Arrhythmia - evaluation and management  Shona Needles Keanna Tugwell 06/16/2019, 5:00 AM

## 2019-06-16 NOTE — Progress Notes (Signed)
Insulin gtt off at 0830 after SSI and lantus given earlier, rechecked CBG at 0900 and now on q 4 hour. Poison control Jenna checked in at [place] and assessed labs and VS.

## 2019-06-16 NOTE — Progress Notes (Signed)
   Patient Name: Melissa Foley Date of Encounter: 06/16/2019, 9:32 AM    Subjective  Remains on vent and getting CRRT Does not participate - in exam Afib RVR overnight   Objective  BP (!) 115/55   Pulse 92   Temp 98.8 F (37.1 C)   Resp (!) 31   Ht 5\' 7"  (1.702 m)   Wt 132 kg   LMP  (LMP Unknown)   SpO2 95%   BMI 45.58 kg/m  Obese, critically ill on vent/ETT Eyes anicteric, PERRLA Lungs clr Cor s1s2 abd is very obese and nontender   CBC Latest Ref Rng & Units 06/16/2019 06/16/2019 06/15/2019  WBC 4.0 - 10.5 K/uL - 14.7(H) -  Hemoglobin 12.0 - 15.0 g/dL 9.5(L) 10.0(L) 12.9  Hematocrit 36.0 - 46.0 % 28.0(L) 31.3(L) 38.0  Platelets 150 - 400 K/uL - 199 -   Lab Results  Component Value Date   ALT 2,828 (H) 06/16/2019   AST 2,150 (H) 06/16/2019   ALKPHOS 189 (H) 06/16/2019   BILITOT 1.8 (H) 06/16/2019   Lab Results  Component Value Date   INR 1.5 (H) 06/16/2019   INR 1.5 (H) 06/15/2019   INR 1.7 (H) 06/14/2019   NH3 62  Lab Results  Component Value Date   CREATININE 3.28 (H) 06/16/2019   BUN 47 (H) 06/16/2019   NA 138 06/16/2019   K 4.3 06/16/2019   CL 101 06/16/2019   CO2 11 (L) 06/16/2019     Assessment and Plan  Acute liver failure from acetaminophen overdose NAFLD at baseline MSOF - respiratory and renal   INR stable, LFT's better  Stable to improved but critically ill still MELD 26 (renal driving this) Continue aggressive supportive care as you are  Will follow    Gatha Mayer, MD, [facility] 06/16/2019 9:32 AM Pager [phone]

## 2019-06-16 NOTE — Progress Notes (Signed)
Pt appearing to have further enlarged abdomen, no BM today since before 6AM despite free water boluses and lactulose... change from yesterday. Placed NGT to suction to assess residual; 400 ml out of dilute TF. Dr Elsworth Soho updated and new orders recvd. Per MD, will hold TF for a couple of hours and remain on LIWS.

## 2019-06-16 NOTE — Progress Notes (Signed)
63 year old diabetic hypertensive with CKD stage III and seizure disorder who was admitted 7/10 with altered mental status found to have glucose more than 900 and Tylenol level of 44 with transaminitis, head CT negative Course complicated by acute liver failure and AKI, started on CRRT 7/13 for severe acidosis  LFTs peaked at 10K and are now trending down Acidosis is improved to pH 7.30 range.  She is tolerating CRRT She remains critically ill, intubated on fentanyl and Precedex drip, on low-dose Levophed, urine output poor, soft and nontender distended obese abdomen, does not follow commands, RA SS -2, 1+ edema, S1-S2 distant, decreased breath sounds bilateral.  Chest x-ray personally reviewed which shows interstitial prominence but no clear infiltrate, ET tube in position.  Blood cultures negative so far.  Impression/plan  Acute liver failure-acetaminophen toxicity with underlying fatty liver.  To clarify history of EtOH use.  Improving with downtrending transaminitis, INR okay -She has been started on lactulose for increased ammonia levels -Continue course of N-acetylcysteine, GI following.  AKI -continue CRRT, keep even while on pressors, acidosis is improving, she does have underlying CKD stage III and hopeful for renal recovery.  Acute encephalopathy-related to above. Continue Precedex drip with goal RA SS 0 to -1, will discontinue fentanyl drip and change to intermittent  Shock/hypotension-doubt sepsis but will continue empiric Zosyn for now  Acute respiratory failure-can initiate spontaneous breathing trials but not ready for extubation yet  Hyperglycemia-off insulin drip, with Lantus 20 units given  The patient is critically ill with multiple organ systems failure and requires high complexity decision making for assessment and support, frequent evaluation and titration of therapies, application of advanced monitoring technologies and extensive interpretation of multiple  databases. Critical Care Time devoted to patient care services described in this note independent of APP/resident  time is 35 minutes.    Kara Mead MD. [facility]. [facility] Pager [phone] If no response call [phone]   06/16/2019

## 2019-06-16 NOTE — Progress Notes (Addendum)
NAME:  Melissa Foley, MRN:  [id number], DOB:  October 21, 1956, LOS: 4 ADMISSION DATE:  06/09/2019, CONSULTATION DATE:  06/15/2019 REFERRING MD:  [facility], CHIEF COMPLAINT:  AMS  Brief History   63 yo F found unresponsive by neighbor, seizing in ED, found to have Acetaminophen toxicity and Glu > 700. Intubated and has R fem CVC  History of present illness   63 yo F PMH DM, HTN, COPD, CKD III, CVAs, Seizure Disorder, who presents to [facility] ED via EMS for AMS. Patient was found unresponsive on her couch by a neighbor who then dispatched EMS. Per EMS, patient was exhibiting respiratory distress, as well as rhythmic eye movement. In ED, patient continues to be unresponsive. Patient noted to have SpO2 70s. No change in mental status with Narcan. Nasal trumpet placed and rhythmic facial and eye contractions noted and was thought to be actively seizing. Ativan was administered without resolution. Patient then intubated. Patient noted to have glucose >900 without ketones in urine, found to have acetaminophen level 44, transaminitis, ProBNP >1000.  CT scan report states no acute intracranial abnormality. Patient started on insulin gtt and NAC   Past Medical History  CKD III HTN HLD DM CVA Seizure Disorder COPD  Significant Hospital Events   7/10 intubated at [facility], CVC placed, [facility]  7/11 No overnight events No seizures noted overnight 7/13 Started CRRT  Consults:  Nephrology  Gastreoenterology  Procedures:  7/10 ETT>> 7/10 R Fem CVC>>  7/13 RIJ HD cath >> 7/13 R radial A line >>  Significant Diagnostic Tests:  7/10 CT Head non-con Oval Linsey) read: no acute intracranial abnormality 7/10 CXR Oval Linsey) read: no cardiomegaly, mild bibasilar atelectasis, no pulmonary edema   Micro Data:  7/10 SARS CoV2> negative ([place] performed)  7/12 BC NGTD  Antimicrobials:  7/12 Zosyn >   Interim history/subjective:  Started on CRRT on 7/13. Continues on NAC infusion.   Heavily sedated. Afebrile overnight. Episode of A-fib, with spontaneous conversion to NSR.   Objective   Blood pressure (!) 104/50, pulse (!) 115, temperature 98.4 F (36.9 C), resp. rate (!) 30, height 5\' 7"  (1.702 m), weight 132 kg, SpO2 94 %.    Vent Mode: PRVC FiO2 (%):  [50 %-60 %] 60 % Set Rate:  [24 bmp] 24 bmp Vt Set:  [450 mL] 450 mL PEEP:  [5 cmH20] 5 cmH20 Plateau Pressure:  [27 cmH20] 27 cmH20   Intake/Output Summary (Last 24 hours) at 06/16/2019 0657 Last data filed at 06/16/2019 0600 Gross per 24 hour  Intake 5403.84 ml  Output 3207 ml  Net 2196.84 ml   Filed Weights   06/14/19 0500 06/15/19 0500 06/16/19 0500  Weight: 125.8 kg 129.7 kg 132 kg   Examination: Critically ill, morbidly obese, minimal response to painful stimuli. Moist oral mucosa Trachea midline, ETT and OGT in place with no pressure ulceration.  S1-S2 appreciated, extremities warm.  Tachypnea with diffuse rhonchi. Abdomen is distended, firm, with high pitched bowel sounds  Heavily sedated. Not following commands and not orienting to voice.  Resolved Hospital Problem list   HHS  Assessment & Plan:   Critically ill due to acute hypoxic respiratory failure requiring mechanical ventilation. Multiorgan failure  - Continue full MV support - Trend ABG/ CXR - VAP bundle - Continue zosyn day day 3/x - Wide pulse pressure but unclear cause. Would target SBP >90 and titrate Levo as tolerated  - Daily SBT if meets criteria  - RASS goal of -1 to 0  Acetaminophen  toxicity with worsening transaminitis - Unclear whether this was an intentional overdose - Appreciate GI consult - AST/ALT down trending. INR stable. Continue NAC. Will discuss with pharmacy whether increasing dosage is needed given initiation of CRRT - Trend LFTs/ INR (Follow INR as marker of hepatic function- trend has been stable despite worsening AST/ ALT) - Ammonia down trending. Continue Lactulose  Hyperosmolar Hyperglycemic state  without ketosis.  - Transition to q4hr SubQ insulin with tube feeds and basal insulin  - Target CBG <180 - Slow titration of insulin as patient is increased risk for hypoglycemia with liver/renal dysfunction.   AKI on CKD 3 with oliguria Worsening metabolic acidosis - Appreciate nephrology consult  - Currently on CRRT. Metabolic profile improving but continues to have significant AG. Appropriate respiratory compensation  - Trend renal/ mag/ strict I/Os / urinary output  A-fib  - Run of A-fib RVR while being bathed - Now back in NSR  - Continue to monitor   Critically ill due to toxic metabolic encephalopathy requiring re-initiation of sedative infusions. -Combination of HHS and acetaminophen overdose - PAD protocol with fentanyl and precedex RASS goal 0/-1 - Ongoing neuro exams  Seizure Disorder  - Per Oval Linsey chart, actively seizing on presentation. Report of features is variable.  - 7/11 EEG neg for epileptiform activity, c/w generalized nonspecific cerebral dysfunction.  - Continue keppra - Seizure precautions   Possible congestive heart failure - Elevated BNP but relatively normal Echo 7/11 - Appear euvolemic on PE  Will discuss the case further with Dr. Nelda Marseille.   Best practice:  Diet: Start tube feeds per protocol Pain/Anxiety/Delirium protocol (if indicated): fentanyl infusion and precedex infusion VAP protocol (if indicated): Yes DVT prophylaxis: SQ heparin GI prophylaxis: Protonix Glucose control: q4hr SubQ Insulin  Mobility: BR Code Status: Full  Family Communication: Pending Disposition: Admit to ICU   Labs   CBC: Recent Labs  Lab 06/16/2019 2213  06/13/19 0449  06/14/19 2253 06/15/19 0532 06/15/19 1127 06/16/19 0456 06/16/19 0500  WBC 16.3*  --  16.4*  --  7.1  --   --  14.7*  --   HGB 11.9*   < > 11.8*   < > 10.3* 9.2* 12.9 10.0* 9.5*  HCT 37.3   < > 36.5   < > 32.3* 27.0* 38.0 31.3* 28.0*  MCV 79.7*  --  80.4  --  80.5  --   --  80.1  --   PLT  292  --  263  --  226  --   --  199  --    < > = values in this interval not displayed.   Basic Metabolic Panel: Recent Labs  Lab 06/13/19 1145 06/13/19 1700  06/14/19 0239  06/14/19 1711 06/14/19 2040  06/15/19 0734 06/15/19 1127 06/15/19 1803 06/16/19 0456 06/16/19 0500  NA 143 143   < > 143   < > 146* 149*   < > 150* 141 147* 140 138  K 3.3* 3.4*   < > 3.4*   < > 3.9 4.0   < > 4.8 4.3 3.8 4.4 4.3  CL 103 104   < > 102   < > 102 105  --  104  --  107 101  --   CO2 21* 20*   < > 18*   < > 15* 13*  --  13*  --  10* 11*  --   GLUCOSE 160* 171*   < > 271*   < > 140* 130*  --  233*  --  130* 214*  --   BUN 36* 39*   < > 48*   < > 57* 61*  --  67*  --  61* 47*  --   CREATININE 2.41* 2.83*   < > 3.12*   < > 4.02* 4.24*  --  5.05*  --  3.98* 3.28*  --   CALCIUM 8.2* 8.3*   < > 8.2*   < > 8.5* 8.3*  --  8.2*  --  7.4* 8.3*  --   MG 2.1 2.1  --  2.2  --  2.5*  --   --   --   --   --  2.4  --   PHOS 3.1 3.3  --  4.2  --  5.8*  --   --  6.2*  --  5.4* 5.2*  --    < > = values in this interval not displayed.   GFR: Estimated Creatinine Clearance: 25.5 mL/min (A) (by C-G formula based on SCr of 3.28 mg/dL (H)). Recent Labs  Lab 06/22/2019 2213 06/13/19 0259 06/13/19 0449 06/14/19 2253 06/15/19 0509 06/16/19 0456  WBC 16.3*  --  16.4* 7.1  --  14.7*  LATICACIDVEN 4.8* 1.6  --  3.2* 2.9*  --     Liver Function Tests: Recent Labs  Lab 06/25/2019 2213 06/13/19 0449 06/14/19 0239 06/15/19 0509 06/15/19 0734 06/15/19 1803 06/16/19 0456  AST 555* 599* 3,362* >10,000*  --   --  2,150*  ALT 314* 358* 1,705* 4,740*  --   --  PENDING  ALKPHOS 119 116 99 154*  --   --  189*  BILITOT 0.9 0.8 0.7 0.9  --   --  1.8*  PROT 6.0* 5.6* 4.9* 4.9*  --   --  5.2*  ALBUMIN 2.7* 2.6* 2.0* 1.7* 1.6* 1.4* 1.5*   No results for input(s): LIPASE, AMYLASE in the last 168 hours. Recent Labs  Lab 06/15/19 0734 06/16/19 0456  AMMONIA 75* 62*    ABG    Component Value Date/Time   PHART 7.301 (L)  06/16/2019 0500   PCO2ART 26.9 (L) 06/16/2019 0500   PO2ART 131.0 (H) 06/16/2019 0500   HCO3 13.3 (L) 06/16/2019 0500   TCO2 14 (L) 06/16/2019 0500   ACIDBASEDEF 12.0 (H) 06/16/2019 0500   O2SAT 99.0 06/16/2019 0500    Coagulation Profile: Recent Labs  Lab 06/13/19 0242 06/13/19 0449 06/14/19 1004 06/15/19 0509 06/16/19 0456  INR 1.5* 1.4* 1.7* 1.5* 1.5*   Cardiac Enzymes: Recent Labs  Lab 06/04/2019 2213  CKTOTAL 66   HbA1C: Hgb A1c MFr Bld  Date/Time Value Ref Range Status  07/02/2019 10:13 PM >15.5 (H) 4.8 - 5.6 % Final    Comment:    (NOTE) **Verified by repeat analysis**         Prediabetes: 5.7 - 6.4         Diabetes: >6.4         Glycemic control for adults with diabetes: <7.0     CBG: Recent Labs  Lab 06/16/19 0207 06/16/19 0311 06/16/19 0447 06/16/19 0544 06/16/19 0649  GLUCAP 154* 170* 168* 203* 195*   Ina Homes, MD IMTS PGY3

## 2019-06-16 NOTE — Progress Notes (Signed)
[facility] Progress Note Patient Name: Melissa Foley DOB: [birth year] MRN: [id number]   Date of Service  06/16/2019  HPI/Events of Note  Hypotension requiring increase in Norepinephrine infusion rate  eICU Interventions  Albumin 5 % 25 gm iv bolus x 1        Melissa Foley U Lebert Lovern 06/16/2019, 11:38 PM

## 2019-06-16 NOTE — Progress Notes (Signed)
Pt w/ burst of afib w/ RVR at 1115: attempted to induce vagal w/ suction, monitored for spontaneous resolution before calling Dr. Elsworth Soho. Updated MD to assessments, interventions and response. Orders recvd for lopressor. Arrhythmia refractory to lopressor and further consulted Dr. Elsworth Soho. Orders recvd for Amiodarone bolus and infusion. Converted back to SR immediately after bolus begun.

## 2019-06-16 NOTE — Progress Notes (Signed)
[facility] Progress Note   Subjective:   Developed A fib with RVR while bathed; K and Mag normal.  Running CRRT.   Remains on vasopressor support.  I/Os 5.5 / 3.5, UF 2.7L, UOP 351m   Objective Vitals:   06/16/19 0600 06/16/19 0700 06/16/19 0715 06/16/19 0730  BP: (!) 104/50 (!) 110/50    Pulse: (!) 115 88 85 85  Resp: (!) 30 (!) 29 (!) 30 (!) 30  Temp: 98.4 F (36.9 C) 98.8 F (37.1 C) 98.8 F (37.1 C) 98.8 F (37.1 C)  TempSrc:      SpO2: 94% 95% 93% 94%  Weight:      Height:       Physical Exam Gen: obese woman intubated and sedated  ENT: ETT in place Neck: obese CV: RRR Abd: soft, obese, +BS GU: foley draining amber urine Extr: trace to 1+  LE edema Neuro: intubated and sedated Skin: cool and dry  Additional Objective Labs: Basic Metabolic Panel: Recent Labs  Lab 06/15/19 0734  06/15/19 1803 06/16/19 0456 06/16/19 0500  NA 150*   < > 147* 140 138  K 4.8   < > 3.8 4.4 4.3  CL 104  --  107 101  --   CO2 13*  --  10* 11*  --   GLUCOSE 233*  --  130* 214*  --   BUN 67*  --  61* 47*  --   CREATININE 5.05*  --  3.98* 3.28*  --   CALCIUM 8.2*  --  7.4* 8.3*  --   PHOS 6.2*  --  5.4* 5.2*  --    < > = values in this interval not displayed.   Liver Function Tests: Recent Labs  Lab 06/14/19 0239 06/15/19 0509 06/15/19 0734 06/15/19 1803 06/16/19 0456  AST 3,362* >10,000*  --   --  2,150*  ALT 1,705* 4,740*  --   --  PENDING  ALKPHOS 99 154*  --   --  189*  BILITOT 0.7 0.9  --   --  1.8*  PROT 4.9* 4.9*  --   --  5.2*  ALBUMIN 2.0* 1.7* 1.6* 1.4* 1.5*   No results for input(s): LIPASE, AMYLASE in the last 168 hours. CBC: Recent Labs  Lab 06/14/2019 2213  06/13/19 0449  06/14/19 2253  06/15/19 1127 06/16/19 0456 06/16/19 0500  WBC 16.3*  --  16.4*  --  7.1  --   --  14.7*  --   HGB 11.9*   < > 11.8*   < > 10.3*   < > 12.9 10.0* 9.5*  HCT 37.3   < > 36.5   < > 32.3*   < > 38.0 31.3* 28.0*  MCV 79.7*  --  80.4  --  80.5  --   --   80.1  --   PLT 292  --  263  --  226  --   --  199  --    < > = values in this interval not displayed.   Blood Culture    Component Value Date/Time   SDES BLOOD RIGHT HAND 06/14/2019 2313   SPECREQUEST  06/14/2019 2313    BOTTLES DRAWN AEROBIC AND ANAEROBIC Blood Culture results may not be optimal due to an inadequate volume of blood received in culture bottles   CULT  06/14/2019 2313    NO GROWTH < 24 HOURS Performed [facility] [address], [place] [id number]  REPTSTATUS PENDING 06/14/2019 2313    Cardiac Enzymes: Recent Labs  Lab 06/23/2019 2213  CKTOTAL 66   CBG: Recent Labs  Lab 06/16/19 0311 06/16/19 0447 06/16/19 0544 06/16/19 0649 06/16/19 0756  GLUCAP 170* 168* 203* 195* 143*   Iron Studies: No results for input(s): IRON, TIBC, TRANSFERRIN, FERRITIN in the last 72 hours. _0 @ Studies/Results: US Renal  Result Date: 06/15/2019 CLINICAL DATA:  Acute renal failure EXAM: RENAL / URINARY TRACT ULTRASOUND COMPLETE COMPARISON:  Abdominal CT 04/20/2018 FINDINGS: Right Kidney: Renal measurements: 11.8 x 4.5 x 4.3 cm = volume: 121 mL . Echogenicity within normal limits. No mass or hydronephrosis visualized. Left Kidney: Renal measurements: 11.3 x 5.6 x 5.8 cm = volume: 190 mL. Echogenicity within normal limits. No mass or hydronephrosis visualized. Bladder: Completely decompressed by Foley catheter. Incidental echogenic liver. IMPRESSION: 1. Negative renal ultrasound. 2. Completely decompressed bladder by a Foley catheter. 3. Hepatic steatosis. Electronically Signed   By: Monte Fantasia M.D.   On: 06/15/2019 10:03   Dg Chest Port 1 View  Result Date: 06/15/2019 CLINICAL DATA:  Central line placement. Intubated patient. Follow-up exam. EXAM: PORTABLE CHEST 1 VIEW COMPARISON:  06/14/2019 and older studies. FINDINGS: New dual lumen right internal jugular central venous catheter has its tip projecting in the mid superior vena cava. Endotracheal  tube and nasal/orogastric tube are stable and well positioned. Hazy opacity noted in the left upper lobe lingula on the most recent prior exam has improved. There are now linear opacities at the left lung base and along the minor fissure on the right, most likely atelectasis. Findings are accentuated by low lung volumes. Remainder of the lungs is clear. No convincing pleural effusion.  No pneumothorax. IMPRESSION: 1. New right internal jugular dual-lumen central venous catheter. Tip lies in the mid superior vena cava. No pneumothorax. 2. Improved lung aeration with decreased airspace in the left upper lobe lingula. No new lung abnormalities. Electronically Signed   By: Lajean Manes M.D.   On: 06/15/2019 15:17   Dg Chest Port 1 View  Result Date: 06/14/2019 CLINICAL DATA:  Fevers EXAM: PORTABLE CHEST 1 VIEW COMPARISON:  06/13/2019 FINDINGS: Cardiac shadow is mildly enlarged but stable. Endotracheal tube and nasogastric catheter are seen in satisfactory position. The lungs are well aerated bilaterally. Patchy infiltrate in the left base is noted. This is slightly increased when compared with the prior exam. IMPRESSION: Increasing left basilar infiltrate. Tubes and lines as described. Electronically Signed   By: Inez Catalina M.D.   On: 06/14/2019 22:47   Medications: .  prismasol BGK 4/2.5 200 mL/hr at 06/15/19 1550  .  prismasol BGK 4/2.5 200 mL/hr at 06/15/19 1549  . acetylcysteine 15 mg/kg/hr (06/16/19 0700)  . dexmedetomidine (PRECEDEX) IV infusion 0.8 mcg/kg/hr (06/16/19 0700)  . fentaNYL infusion INTRAVENOUS 100 mcg/hr (06/16/19 0700)  . levETIRAcetam Stopped (06/15/19 2215)  . norepinephrine (LEVOPHED) Adult infusion 10 mcg/min (06/16/19 0700)  . piperacillin-tazobactam (ZOSYN)  IV 12.5 mL/hr at 06/16/19 0700  . prismasol BGK 4/2.5 2,000 mL/hr at 06/16/19 0658   . chlorhexidine      . chlorhexidine gluconate (MEDLINE KIT)  15 mL Mouth Rinse BID  . Chlorhexidine Gluconate Cloth  6 each Topical  Daily  . feeding supplement (PRO-STAT SUGAR FREE 64)  30 mL Per Tube BID  . feeding supplement (VITAL HIGH PROTEIN)  1,000 mL Per Tube Q24H  . free water  300 mL Per Tube Q4H  . heparin  5,000 Units Subcutaneous Q8H  . insulin aspart  0-15 Units Subcutaneous Q4H  . insulin glargine  20 Units Subcutaneous Daily  . lactulose  20 g Oral TID  . mouth rinse  15 mL Mouth Rinse 10 times per day  . pantoprazole (PROTONIX) IV  40 mg Intravenous QHS  . sennosides  10 mL Per Tube BID  . sodium chloride flush  10-40 mL Intracatheter Q12H    Assessment/Plan: **Acute liver injury: secondary to APAP toxicity, NAC per poison control.  Care per primary and GI.  Not currently liver transplant candidate.  INR stable 1.5.  AST improved from >10k to 2150 this AM.     **AKI:  Presumably ATN in setting of shock.  Likely with mild underlying CKD due to DM. Presenting CK normal.  Renal US 7/13 normal.  UA from foley specimen + protein, blood, leukocytes - would repeat after foley removed, do not suspect GN.  In light of trajectory with worsening renal function, dec UOP, hypotension and worsening acidosis initiated CRRT 7/13 and she's tolerating it well.  Continued UOP is encouraging for recovery.  **Acidosis:  AGMA from lactic acidosis + renal failure.  pH improved this AM consistent with compensated metabolic acidosis.  Increase pre/post filter dialysate this AM.   **Hypernatremia: Resolved. FWF 300 q4H.   **Acute hypoxic respiratory failure: CXR with LLL opacity c/w aspiration.  Currently on vent 60% FiO2.   **DM complicated by HHS on presentation:  On insulin gtt with improved BG.  Per primary. A1c was 12.9 in 04/2018 at [facility], no others for review.   **AMS: no acute issue on CT or EEG.  Encephalopathy likely multifactorial.  Per Primary.  **Seizure DO:  On keppra; dosing per Pharmacy with CRRT initiated.   Jannifer Hick MD 06/16/2019, 8:00 AM  [facility] Pager: [phone]

## 2019-06-16 NOTE — Progress Notes (Signed)
[facility] Progress Note Patient Name: Beonca Gibb DOB: 05-07-1956 MRN: [id number]   Date of Service  06/16/2019  HPI/Events of Note  Abdominal distension secondary to ileus.  eICU Interventions  NG tube to LIS overnight then resume enteral nutrition in AM.        Wende Crease U [place] Nellums 06/16/2019, 9:28 PM

## 2019-06-17 ENCOUNTER — Inpatient Hospital Stay (HOSPITAL_COMMUNITY): Payer: Medicaid Other

## 2019-06-17 DIAGNOSIS — R6521 Severe sepsis with septic shock: Secondary | ICD-10-CM

## 2019-06-17 DIAGNOSIS — A419 Sepsis, unspecified organism: Secondary | ICD-10-CM

## 2019-06-17 LAB — BLOOD GAS, ARTERIAL
Acid-base deficit: 25 mmol/L — ABNORMAL HIGH (ref 0.0–2.0)
Acid-base deficit: 21.5 mmol/L — ABNORMAL HIGH (ref 0.0–2.0)
Acid-base deficit: 15.2 mmol/L — ABNORMAL HIGH (ref 0.0–2.0)
Bicarbonate: 5.7 mmol/L — ABNORMAL LOW (ref 20.0–28.0)
Bicarbonate: 8 mmol/L — ABNORMAL LOW (ref 20.0–28.0)
Bicarbonate: 12.2 mmol/L — ABNORMAL LOW (ref 20.0–28.0)
Drawn by: 55062
Drawn by: 55062
Drawn by: 345601
FIO2: 60
FIO2: 60
FIO2: 0.6
MECHVT: 450 mL
MECHVT: 450 mL
O2 Saturation: 83.6 %
O2 Saturation: 91 %
O2 Saturation: 87.2 %
PEEP: 8 cmH2O
PEEP: 8 cmH2O
PEEP: 8 cmH2O
Patient temperature: 95
Patient temperature: 94.4
Patient temperature: 94.5
RATE: 24 resp/min
RATE: 24 resp/min
pCO2 arterial: 32.5 mmHg (ref 32.0–48.0)
pCO2 arterial: 33.9 mmHg (ref 32.0–48.0)
pCO2 arterial: 34.5 mmHg (ref 32.0–48.0)
pH, Arterial: 6.858 — CL (ref 7.350–7.450)
pH, Arterial: 6.983 — CL (ref 7.350–7.450)
pH, Arterial: 7.157 — CL (ref 7.350–7.450)
pO2, Arterial: 78.7 mmHg — ABNORMAL LOW (ref 83.0–108.0)
pO2, Arterial: 89.8 mmHg (ref 83.0–108.0)
pO2, Arterial: 64 mmHg — ABNORMAL LOW (ref 83.0–108.0)

## 2019-06-17 LAB — COMPREHENSIVE METABOLIC PANEL
ALT: 2191 U/L — ABNORMAL HIGH (ref 0–44)
AST: 1536 U/L — ABNORMAL HIGH (ref 15–41)
Albumin: 2.2 g/dL — ABNORMAL LOW (ref 3.5–5.0)
Alkaline Phosphatase: 383 U/L — ABNORMAL HIGH (ref 38–126)
Anion gap: 35 — ABNORMAL HIGH (ref 5–15)
BUN: 26 mg/dL — ABNORMAL HIGH (ref 8–23)
CO2: 9 mmol/L — ABNORMAL LOW (ref 22–32)
Calcium: 8.3 mg/dL — ABNORMAL LOW (ref 8.9–10.3)
Chloride: 100 mmol/L (ref 98–111)
Creatinine, Ser: 2.26 mg/dL — ABNORMAL HIGH (ref 0.44–1.00)
GFR calc Af Amer: 26 mL/min — ABNORMAL LOW (ref >60)
GFR calc non Af Amer: 23 mL/min — ABNORMAL LOW (ref >60)
Glucose, Bld: 38 mg/dL — CL (ref 70–99)
Potassium: 4.7 mmol/L (ref 3.5–5.1)
Sodium: 144 mmol/L (ref 135–145)
Total Bilirubin: 1.3 mg/dL — ABNORMAL HIGH (ref 0.3–1.2)
Total Protein: 5.7 g/dL — ABNORMAL LOW (ref 6.5–8.1)

## 2019-06-17 LAB — CBC WITH DIFFERENTIAL/PLATELET
Abs Immature Granulocytes: 0.8 10*3/uL — ABNORMAL HIGH (ref 0.00–0.07)
Basophils Absolute: 0.2 10*3/uL — ABNORMAL HIGH (ref 0.0–0.1)
Basophils Relative: 1 %
Eosinophils Absolute: 0.2 10*3/uL (ref 0.0–0.5)
Eosinophils Relative: 1 %
HCT: 33 % — ABNORMAL LOW (ref 36.0–46.0)
Hemoglobin: 9.3 g/dL — ABNORMAL LOW (ref 12.0–15.0)
Lymphocytes Relative: 22 %
Lymphs Abs: 4.5 10*3/uL — ABNORMAL HIGH (ref 0.7–4.0)
MCH: 25.5 pg — ABNORMAL LOW (ref 26.0–34.0)
MCHC: 28.2 g/dL — ABNORMAL LOW (ref 30.0–36.0)
MCV: 90.4 fL (ref 80.0–100.0)
Monocytes Absolute: 0.8 10*3/uL (ref 0.1–1.0)
Monocytes Relative: 4 %
Myelocytes: 3 %
Neutro Abs: 13.9 10*3/uL — ABNORMAL HIGH (ref 1.7–7.7)
Neutrophils Relative %: 68 %
Platelets: 141 10*3/uL — ABNORMAL LOW (ref 150–400)
Promyelocytes Relative: 1 %
RBC: 3.65 MIL/uL — ABNORMAL LOW (ref 3.87–5.11)
RDW: 18.8 % — ABNORMAL HIGH (ref 11.5–15.5)
WBC: 20.4 10*3/uL — ABNORMAL HIGH (ref 4.0–10.5)
nRBC: 16 /100 WBC — ABNORMAL HIGH
nRBC: 8.1 % — ABNORMAL HIGH (ref 0.0–0.2)

## 2019-06-17 LAB — POCT I-STAT 7, (LYTES, BLD GAS, ICA,H+H)
Acid-base deficit: 17 mmol/L — ABNORMAL HIGH (ref 0.0–2.0)
Bicarbonate: 11.6 mmol/L — ABNORMAL LOW (ref 20.0–28.0)
Calcium, Ion: 0.74 mmol/L — CL (ref 1.15–1.40)
HCT: 26 % — ABNORMAL LOW (ref 36.0–46.0)
Hemoglobin: 8.8 g/dL — ABNORMAL LOW (ref 12.0–15.0)
O2 Saturation: 87 %
Patient temperature: 34.7
Potassium: 4.3 mmol/L (ref 3.5–5.1)
Sodium: 137 mmol/L (ref 135–145)
TCO2: 13 mmol/L — ABNORMAL LOW (ref 22–32)
pCO2 arterial: 34.5 mmHg (ref 32.0–48.0)
pH, Arterial: 7.121 — CL (ref 7.350–7.450)
pO2, Arterial: 62 mmHg — ABNORMAL LOW (ref 83.0–108.0)

## 2019-06-17 LAB — PROTIME-INR
INR: 1.4 — ABNORMAL HIGH (ref 0.8–1.2)
Prothrombin Time: 17.4 seconds — ABNORMAL HIGH (ref 11.4–15.2)

## 2019-06-17 LAB — MAGNESIUM: Magnesium: 2.9 mg/dL — ABNORMAL HIGH (ref 1.7–2.4)

## 2019-06-17 LAB — HEPATITIS PANEL, ACUTE
HCV Ab: <0.1 s/co ratio (ref 0.0–0.9)
Hep A IgM: NEGATIVE
Hep B C IgM: NEGATIVE
Hepatitis B Surface Ag: NEGATIVE

## 2019-06-17 LAB — GLUCOSE, CAPILLARY
Glucose-Capillary: 111 mg/dL — ABNORMAL HIGH (ref 70–99)
Glucose-Capillary: 116 mg/dL — ABNORMAL HIGH (ref 70–99)
Glucose-Capillary: 124 mg/dL — ABNORMAL HIGH (ref 70–99)
Glucose-Capillary: 18 mg/dL — CL (ref 70–99)
Glucose-Capillary: 27 mg/dL — CL (ref 70–99)
Glucose-Capillary: 74 mg/dL (ref 70–99)

## 2019-06-17 LAB — CORTISOL: Cortisol, Plasma: 40 ug/dL

## 2019-06-17 LAB — PATHOLOGIST SMEAR REVIEW

## 2019-06-17 LAB — LACTIC ACID, PLASMA: Lactic Acid, Venous: >11 mmol/L (ref 0.5–1.9)

## 2019-06-17 MED ORDER — SODIUM BICARBONATE 8.4 % IV SOLN
50.0000 meq | Freq: Once | INTRAVENOUS | Status: AC
  Administered 2019-06-17: 50 meq via INTRAVENOUS

## 2019-06-17 MED ORDER — STERILE WATER FOR INJECTION IV SOLN
INTRAVENOUS | Status: DC
  Administered 2019-06-17 (×6): via INTRAVENOUS_CENTRAL
  Filled 2019-06-17 (×9): qty 150

## 2019-06-17 MED ORDER — ALBUMIN HUMAN 25 % IV SOLN
25.0000 g | Freq: Once | INTRAVENOUS | Status: AC
  Administered 2019-06-17: 12.5 g via INTRAVENOUS
  Filled 2019-06-17: qty 100

## 2019-06-17 MED ORDER — EPINEPHRINE NICU 0.1 MG/ML INJECTION: 0.1000 mL/kg | Freq: Once | INTRAMUSCULAR | Status: DC

## 2019-06-17 MED ORDER — STERILE WATER FOR INJECTION IV SOLN
INTRAVENOUS | Status: DC
  Administered 2019-06-17 (×3): via INTRAVENOUS_CENTRAL
  Filled 2019-06-17 (×6): qty 150

## 2019-06-17 MED ORDER — INSULIN ASPART 100 UNIT/ML ~~LOC~~ SOLN: 0.0000 [IU] | SUBCUTANEOUS | Status: DC

## 2019-06-17 MED ORDER — EPINEPHRINE 1 MG/10ML IJ SOSY: 0.1000 mg | PREFILLED_SYRINGE | Freq: Once | INTRAMUSCULAR | Status: DC

## 2019-06-17 MED ORDER — SODIUM CHLORIDE 0.9 % IV SOLN
1.2500 ng/kg/min | INTRAVENOUS | Status: DC
  Administered 2019-06-17: 5 ng/kg/min via INTRAVENOUS
  Filled 2019-06-17: qty 1

## 2019-06-17 MED ORDER — ALBUMIN HUMAN 25 % IV SOLN
50.0000 g | Freq: Once | INTRAVENOUS | Status: AC
  Administered 2019-06-17: 50 g via INTRAVENOUS
  Filled 2019-06-17: qty 50

## 2019-06-17 MED ORDER — SODIUM BICARBONATE 8.4 % IV SOLN
INTRAVENOUS | Status: AC
  Administered 2019-06-17: 50 meq
  Filled 2019-06-17: qty 100

## 2019-06-17 MED ORDER — CALCIUM CHLORIDE 10 % IV SOLN
1.0000 g | Freq: Once | INTRAVENOUS | Status: AC
  Administered 2019-06-17: 1 g via INTRAVENOUS

## 2019-06-17 MED ORDER — SODIUM BICARBONATE 8.4 % IV SOLN
100.0000 meq | Freq: Once | INTRAVENOUS | Status: AC
  Administered 2019-06-17: 100 meq via INTRAVENOUS
  Filled 2019-06-17: qty 100

## 2019-06-17 MED ORDER — SODIUM BICARBONATE 8.4 % IV SOLN
100.0000 meq | Freq: Once | INTRAVENOUS | Status: AC
  Administered 2019-06-17: 100 meq via INTRAVENOUS

## 2019-06-17 MED ORDER — DEXTROSE 5 % IV SOLN: INTRAVENOUS | Status: DC

## 2019-06-17 MED ORDER — DEXTROSE 50 % IV SOLN
INTRAVENOUS | Status: AC
  Administered 2019-06-17: 50 mL
  Filled 2019-06-17: qty 50

## 2019-06-17 MED ORDER — SODIUM BICARBONATE 8.4 % IV SOLN
50.0000 meq | Freq: Once | INTRAVENOUS | Status: AC
  Administered 2019-06-17: 10:00:00 50 meq via INTRAVENOUS

## 2019-06-17 MED ORDER — SODIUM BICARBONATE 8.4 % IV SOLN
INTRAVENOUS | Status: AC
  Filled 2019-06-17: qty 50

## 2019-06-17 MED ORDER — EPINEPHRINE 1 MG/10ML IJ SOSY
0.1000 mg | PREFILLED_SYRINGE | Freq: Once | INTRAMUSCULAR | Status: AC
  Administered 2019-06-17: 0.1 mg via INTRAVENOUS

## 2019-06-17 MED ORDER — PIPERACILLIN-TAZOBACTAM IN DEX 2-0.25 GM/50ML IV SOLN
2.2500 g | Freq: Four times a day (QID) | INTRAVENOUS | Status: DC
  Filled 2019-06-17: qty 50

## 2019-06-17 MED ORDER — SODIUM BICARBONATE 8.4 % IV SOLN
INTRAVENOUS | Status: AC
  Administered 2019-06-17: 04:00:00 150 meq via INTRAVENOUS
  Filled 2019-06-17: qty 200

## 2019-06-17 MED ORDER — EPINEPHRINE 1 MG/10ML IJ SOSY
PREFILLED_SYRINGE | INTRAMUSCULAR | Status: AC
  Administered 2019-06-17: 1 mg
  Filled 2019-06-17: qty 10

## 2019-06-17 MED ORDER — SODIUM BICARBONATE 8.4 % IV SOLN
150.0000 meq | Freq: Once | INTRAVENOUS | Status: AC
  Administered 2019-06-17: 150 meq via INTRAVENOUS

## 2019-06-17 MED ORDER — VASOPRESSIN 20 UNIT/ML IV SOLN
0.0300 [IU]/min | INTRAVENOUS | Status: DC
  Administered 2019-06-17: 02:00:00 0.03 [IU]/min via INTRAVENOUS
  Filled 2019-06-17: qty 2

## 2019-06-18 LAB — CALCIUM, IONIZED: Calcium, Ionized, Serum: 3.8 mg/dL — ABNORMAL LOW (ref 4.5–5.6)

## 2019-06-18 MED FILL — Medication: Qty: 1 | Status: AC

## 2019-06-19 LAB — CULTURE, BLOOD (ROUTINE X 2)
Culture: NO GROWTH
Culture: NO GROWTH
Special Requests: ADEQUATE

## 2019-07-04 NOTE — Progress Notes (Signed)
Critical results pH 6.83 called and given to RN Allegra Grana. Awaiting orders from MD. Will continue to monitor patient.

## 2019-07-04 NOTE — Progress Notes (Signed)
63 year old diabetic hypertensive with CKD stage III and seizure disorder who was admitted 7/10 with altered mental status found to have glucose more than 900 and Tylenol level of 44 with transaminitis, head CT negative Course complicated by acute liver failure and AKI, started on CRRT 7/13 for severe acidosis  Past Medical History  CKD III HTN HLD DM CVA Seizure Disorder COPD  Significant Hospital Events   7/10 intubated at [facility], CVC placed, [facility]  7/11 No overnight events No seizures noted overnight 7/13 Started CRRT  Consults:  Nephrology  Gastreoenterology  Procedures:  7/10 ETT>> 7/10 R Fem CVC>>  7/13 RIJ HD cath >> 7/13 R radial A line >>  Significant Diagnostic Tests:  7/10 CT Head non-con Melissa Foley) read: no acute intracranial abnormality 7/10 CXR Melissa Foley) read: no cardiomegaly, mild bibasilar atelectasis, no pulmonary edema   Micro Data:  7/10 SARS CoV2> negative (Melissa Foley performed)  7/12 BC NGTD  Antimicrobials:  7/12 Zosyn >   EVENTS/ COURSE :  LFTs peaked at 10K and started trending down.  Acidosis and improved by 7/14-7.30 range on CRRT.  [name]  She developed a colonic ileus, fentanyl was discontinued and tube feeds stopped overnight she developed refractory acidosis and worsening abdominal distention and hypotension.  She was maxed out on Levophed, vasopressin was added and Giapreza was added  Abdominal exam and x-ray abdomen and persistent high lactate seem to suggest ischemic bowel.  Surgical opinion was obtained  Unfortunately she developed refractory shock, bradycardic episodes. Daughters were informed and limited CODE STATUS was issued prior to her passing away  Case was referred to medical examiner. Preliminary cause of death-acute liver failure, acute kidney injury, acetaminophen toxicity, ischemic  bowel   Melissa Mead MD. FCCP. [facility] Pager [phone] If no response call [phone]   06/18/2019

## 2019-07-04 NOTE — Progress Notes (Signed)
eLink Physician-Brief Progress Note Patient Name: Melissa Foley DOB: 20-Jul-1956 MRN: [id number]   Date of Service  07/11/2019  HPI/Events of Note  Patient with MOSF and profound metabolic acidosis despite aggressive Rx and CRRT  eICU Interventions  I called her listed next of kin [name] to discuss patient's dire circumstance and high risk of dying , call went to voice mail. I requested that Ms. McKenzie call me for an update on her NOK's clinical status.        Kerry Kass Ogan 07/11/2019, 3:57 AM

## 2019-07-04 NOTE — Progress Notes (Signed)
Inpatient Diabetes Program Recommendations  AACE/ADA: New Consensus Statement on Inpatient Glycemic Control (2015)  Target Ranges:  Prepandial:   less than 140 mg/dL      Peak postprandial:   less than 180 mg/dL (1-2 hours)      Critically ill patients:  140 - 180 mg/dL   Lab Results  Component Value Date   GLUCAP 111 (H) June 27, 2019   HGBA1C >15.5 (H) 06/26/2019    Review of Glycemic Control Results for Melissa Foley, Melissa Foley (MRN [id number]) as of Jun 27, 2019 08:29  Ref. Range June 27, 2019 03:10 Jun 27, 2019 03:38 27-Jun-2019 07:29 2019/06/27 07:48 Jun 27, 2019 07:49  Glucose-Capillary Latest Ref Range: 70 - 99 mg/dL 27 (LL) 116 (H) 18 (LL) 124 (H) 111 (H)   Diabetes history: Type 2 DM Outpatient Diabetes medications: Trulicity 2.70 mg Q/wk, Novolog 30 units QAM, 25 units QPM Current orders for Inpatient glycemic control: Lantus 20 units QD, Novolog 0-15 units Q4H  Inpatient Diabetes Program Recommendations:    Noted severe hypoglycemia, addition of Dextrose and deteriorating status. Consider discontinuing Lantus in an attempt to further prevent hypoglycemia. Text paged S. Minor NP.   Thanks, Bronson Curb, MSN, RNC-OB Diabetes Coordinator [phone] (8a-5p)

## 2019-07-04 NOTE — Progress Notes (Signed)
Critical ABG results shown to Dr. Elsworth Soho

## 2019-07-04 NOTE — Progress Notes (Signed)
63 year old diabetic hypertensive with CKD stage III and seizure disorder who was admitted 7/10 with altered mental status found to have glucose more than 900 and Tylenol level of 44 with transaminitis, head CT negative Course complicated by acute liver failure and AKI, started on CRRT 7/13 for severe acidosis  LFTs peaked at 10K and started trending down.  Acidosis and improved by 7/14-7.30 range on CRRT.  [name]  She developed a colonic ileus, fentanyl was discontinued and tube feeds stopped overnight she developed refractory acidosis and worsening abdominal distention and hypotension.  She was maxed out on Levophed, vasopressin was added and Giapreza was added this morning  She is critically ill, unresponsive, off sedation, extremities appear mottled, on 3 pressors, distended abdomen absent bowel sounds, tympanic to percussion, decreased breath sounds bilateral, S1-S2 tacky, sinus on monitor on amiodarone drip.  Chest x-ray 7/15 personally reviewed which shows bibasilar atelectasis and effusions, no new infiltrates. X-ray abdomen from 7/14 shows colonic distention Labs show worsening acidosis as low as 6.8 which is now correcting to 7.16, lactate more than 11, decreasing LFTs, improving creatinine, otherwise normal electrolytes  Impression/plan  Refractory shock -initially due to acidosis and organ failure but now seems to be related to ischemic gut.  She is on max pressors including Giapreza.  We will get surgical opinion but doubt that she is a candidate for surgery due to multiorgan failure.  Will obtain flat plate of abdomen, continue OGT to LIS, and hold tube feeds. Continue empiric Zosyn although I doubt that this is septic shock, blood cultures have been negative  Acute liver failure -LFTs are improving.  We will continue course of N-acetylcysteine, will defer to surgery if we can  continue lactulose   AKI -continue bicarbonate and CRRT, acidosis is mildly improved but this appears to be predominantly lactic acidosis now and may be difficult to fix by CRRT Keep even while on pressors  Hypoglycemia-Lantus will be discontinued and D5 drip started  Acute respiratory failure-vent settings were reviewed and adjusted, not a candidate for weaning.  Overnight attempts have been made to contact family.  I left messages for daughters Caryl Pina and [place].  Will discuss care limitations  The patient is critically ill with multiple organ systems failure and requires high complexity decision making for assessment and support, frequent evaluation and titration of therapies, application of advanced monitoring technologies and extensive interpretation of multiple databases. Critical Care Time devoted to patient care services described in this note independent of APP/resident  time is 45 minutes.    Kara Mead MD. Shade Flood. [facility] Pager [phone] If no response call [phone]   07/09/19

## 2019-07-04 NOTE — Progress Notes (Signed)
[place] Progress Note Patient Name: Melissa Foley DOB: 09-23-1956 MRN: [id number]   Date of Service  July 10, 2019  HPI/Events of Note  Persistent hypotension, serum Albumin 1.4  eICU Interventions  Stat ABG, Lactic acid, CBC, ionized calcium, Begin Vasopressin infusion, 25 % Albumin 50 gm iv x 1        [name] 2019/07/10, 1:18 AM

## 2019-07-04 NOTE — Progress Notes (Signed)
She had bradycardia/hypotension episode-epi x1 and bicarb x1 and calcium x1 given with improvement in blood pressure Finally reached daughter Ashley-explained that she was on maximum life support. No CPR no cardioversion issued Daughter given permission to visit-up to 4 family members allowed per COVID visitation rules  Melissa Foley V. Elsworth Soho MD

## 2019-07-04 NOTE — Progress Notes (Signed)
S: Ms. Melissa Foley is a 63 year old female with history of poorly controlled T2DM, COPD, CVA, CKD 3, history of seizures, morbid obesity, chronic pain syndrome, anxiety, and depression. She initially presented to [facility] after being found by her neighbor unresponsive with abnormal eye movements. She was noted to have SpO2 in the 70s and was intubated by EMS. She was also thought to be actively seizing, which did not resolve with Ativan administration. Labs were notable for a glucose >900 without ketones in urine, acetaminophen 44, transaminitis, and ProBNP >1000. Patient was transferred to [facility] for critical care management.   Overnight patient became hypotensive requiring 3 pressors, severely acidotic now on bicarb drip with pushes, and hypothermic. Patient is no longer sedated with GCS of 3.   O: Imaging: CXR (7/15): Vascular congestion RUS (7/13): Negative renal ultrasound  Labs:  ABG    Component Value Date/Time   PHART 7.157 (LL) 06-25-19 0456   PCO2ART 34.5 June 25, 2019 0456   PO2ART 64.0 (L) 06/25/2019 0456   HCO3 12.2 (L) 06/25/2019 0456   TCO2 14 (L) 06/16/2019 0500   ACIDBASEDEF 15.2 (H) 06-25-19 0456   O2SAT 87.2 2019-06-25 0456   CMP Latest Ref Rng & Units 2019-06-25 06/16/2019 06/16/2019  Glucose 70 - 99 mg/dL 38(LL) 202(H) -  BUN 8 - 23 mg/dL 26(H) 35(H) -  Creatinine 0.44 - 1.00 mg/dL 2.26(H) 2.47(H) -  Sodium 135 - 145 mmol/L 144 140 138  Potassium 3.5 - 5.1 mmol/L 4.7 4.8 4.3  Chloride 98 - 111 mmol/L 100 102 -  CO2 22 - 32 mmol/L 9(L) 11(L) -  Calcium 8.9 - 10.3 mg/dL 8.3(L) 8.4(L) -  Total Protein 6.5 - 8.1 g/dL 5.7(L) - -  Total Bilirubin 0.3 - 1.2 mg/dL 1.3(H) - -  Alkaline Phos 38 - 126 U/L 383(H) - -  AST 15 - 41 U/L 1,536(H) - -  ALT 0 - 44 U/L 2,191(H) - -   CBC Latest Ref Rng & Units 06/25/2019 06/16/2019 06/16/2019  WBC 4.0 - 10.5 K/uL 20.4(H) - 14.7(H)  Hemoglobin 12.0 - 15.0 g/dL 9.3(L) 9.5(L) 10.0(L)  Hematocrit 36.0 - 46.0 %  33.0(L) 28.0(L) 31.3(L)  Platelets 150 - 400 K/uL 141(L) - 199    Vitals:   June 25, 2019 0730 Jun 25, 2019 0800  BP:  95/70  Pulse: 64 (!) 118  Resp: (!) 30 (!) 25  Temp: (!) 94.6 F (34.8 C) (!) 94.3 F (34.6 C)  SpO2: 96% 93%    PE: General: Chronically ill appearing, obese patient sedated on a ventilator HEENT: Atraumatic, PERRL Heart: S1-S2, tachycardic, irregularly irregular, no murmurs Lungs: Mechanically ventilated, clear breath sounds in the upper fields, decreased in the lower fields Abdomen: distended, firm, tympanic, and diffusely mottled. Absent bowel sounds. Extremities: cool to the touch, no pitting edema, scds in place Neuro: sedated, RASS -5  Assessment:   Patient is 63 year old woman with history of T2DM, COPD, CVA, CKD 3, anxiety, depression, and chronic pain syndrome now with acute kidney, liver, and hypoxic respiratory failure due to acetaminophen overdose. Patient's mother reports that she has been more confused lately, and has been taking tylenol for a tooth abscess, so this is possibly due to repeated supratherapeutic dosing rather than an attempt. Patient became acutely worse overnight, with abdomen concerning for ischemic bowel. Unable to communicate with family overnight, will try again today to update family. At this point, considering her worsening status is likely due to an abdominal pathology for which she is too unstable for  further workup and treatment, Melissa Foley will be best served by transitioning to comfort care.   Critically ill due to acute hypoxic respiratory failure requiring mechanical ventilation. Multiorgan failure  - Continue full MV support - CXR not supportive of PNA at this point, continue to trend - VAP bundle - Continue zosyn day day 3/x - now requiring max Levophed, 0.3 mcg Vasopressin, and max Giapreza for MAPs in the 50s - Daily SBT if meets criteria  - off sedation  Possible Ischemic Bowel - Patient now with distended, firm abdomen  with diffuse mottling and absent bowel sounds. Developed a WBC of 20 overnight, with lactate >11, and worsening acidosis. Concerning for ischemic bowel event. - surgery consult - CTA deferred as patient is requiring 3 pressors  Acetaminophen toxicity with worsening transaminitis - Appreciate GI consult - AST/ALT down trending, bilirubin uptrending. INR stable. Continue NAC.  - Trend LFTs/ INR (Follow INR as marker of hepatic function- trend has been stable despite worsening AST/ ALT) - Ammonia down trending. Continue Lactulose  Hyperosmolar Hyperglycemic state without ketosis.  - Transitioned to q4hr SubQ insulin with tube feeds and basal insulin  - Target CBG <180 - Slow titration of insulin as patient is increased risk for hypoglycemia with liver/renal dysfunction.   AKI on CKD 3 with oliguria Worsening metabolic acidosis - Appreciate nephrology consult  - Currently on CRRT on maximum support. Now severely acidotic, on bicarb gtt with pushes. Lactate >11 overnight -Net -400 yesterday, set to even - Trend renal/ mag/ strict I/Os / urinary output  A-fib  - Converted to Afib w/RVR yesterday, maintained HR>110 overnight - on Amiodarone  - Continue to monitor   Critically ill due to toxic metabolic encephalopathy requiring re-initiation of sedative infusions. -Combination of HHS and acetaminophen overdose - Now off all sedation, RASS -5 - Ongoing neuro exams  Seizure Disorder  - Per [facility] chart, actively seizing on presentation. Report of features is variable.  - 7/11 EEG neg for epileptiform activity, c/w generalized nonspecific cerebral dysfunction.  - Continue keppra - Seizure precautions   Possible congestive heart failure - Elevated BNP but relatively normal Echo 7/11 - Appear euvolemic on PE   Melissa Bloodgood, MS

## 2019-07-04 NOTE — Progress Notes (Signed)
[place] Progress Note Patient Name: Melissa Foley DOB: 1956/04/15 MRN: [id number]   Date of Service  2019-07-10  HPI/Events of Note  Unable to reach listed next of kin Peggie, pt remains hypotensive and acidotic at ph of 7.15  eICU Interventions  I ordered an additional 2 amps of bicarb and 25 gm of 25 % Albumin. I tried to reach family member curtis but the number appears to be a fax line.        Kerry Kass Alaysia Lightle Jul 10, 2019, 5:20 AM

## 2019-07-04 NOTE — Progress Notes (Signed)
[facility] Progress Note   Subjective:   Worsening globally overnight - hypotensive, acidotic, hypothermic.  Family contact attempted numerous times by overnight MD - unsuccessful.  Net -400 yesterday, CRRT set to even.  Pre and post filter fluids now bicarb gtt. UOP 20.  Objective Vitals:   Jun 29, 2019 0301 Jun 29, 2019 0400 2019-06-29 0500 Jun 29, 2019 0600  BP:  101/81 (!) 97/55 (!) 73/59  Pulse: (!) 105 (!) 117 (!) 123 (!) 109  Resp: (!) 37 (!) 37 (!) 33 (!) 35  Temp:  (!) 94.5 F (34.7 C) (!) 94.5 F (34.7 C) (!) 94.5 F (34.7 C)  TempSrc:      SpO2: 95% 95% 91% 92%  Weight:   131 kg   Height:       Physical Exam Gen: obese woman intubated and sedated  ENT: ETT in place Neck: obese CV: RRR Abd: soft, obese, +BS GU: foley draining scant amber urine Extr: trace to 1+  LE edema Neuro: intubated and sedated Skin: cool and dry, on warmer blanket  Additional Objective Labs: Basic Metabolic Panel: Recent Labs  Lab 06/15/19 1803 06/16/19 0456 06/16/19 0500 06/16/19 1604 June 29, 2019 0119  NA 147* 140 138 140 144  K 3.8 4.4 4.3 4.8 4.7  CL 107 101  --  102 100  CO2 10* 11*  --  11* 9*  GLUCOSE 130* 214*  --  202* 38*  BUN 61* 47*  --  35* 26*  CREATININE 3.98* 3.28*  --  2.47* 2.26*  CALCIUM 7.4* 8.3*  --  8.4* 8.3*  PHOS 5.4* 5.2*  --  5.5*  --    Liver Function Tests: Recent Labs  Lab 06/15/19 0509  06/16/19 0456 06/16/19 1604 2019-06-29 0119  AST >10,000*  --  2,150*  --  1,536*  ALT 4,740*  --  2,828*  --  2,191*  ALKPHOS 154*  --  189*  --  383*  BILITOT 0.9  --  1.8*  --  1.3*  PROT 4.9*  --  5.2*  --  5.7*  ALBUMIN 1.7*   < > 1.5* 1.4* 2.2*   < > = values in this interval not displayed.   No results for input(s): LIPASE, AMYLASE in the last 168 hours. CBC: Recent Labs  Lab 06/29/2019 2213  06/13/19 0449  06/14/19 2253  06/16/19 0456 06/16/19 0500 2019-06-29 0119  WBC 16.3*  --  16.4*  --  7.1  --  14.7*  --  20.4*  NEUTROABS  --   --   --    --   --   --   --   --  13.9*  HGB 11.9*   < > 11.8*   < > 10.3*   < > 10.0* 9.5* 9.3*  HCT 37.3   < > 36.5   < > 32.3*   < > 31.3* 28.0* 33.0*  MCV 79.7*  --  80.4  --  80.5  --  80.1  --  90.4  PLT 292  --  263  --  226  --  199  --  141*   < > = values in this interval not displayed.   Blood Culture    Component Value Date/Time   SDES BLOOD RIGHT HAND 06/14/2019 2313   SPECREQUEST  06/14/2019 2313    BOTTLES DRAWN AEROBIC AND ANAEROBIC Blood Culture results may not be optimal due to an inadequate volume of blood received in culture bottles   CULT  06/14/2019 2313  NO GROWTH 2 DAYS Performed at [facility], [place] [address], [place], [place] [address]    REPTSTATUS PENDING 06/14/2019 2313    Cardiac Enzymes: Recent Labs  Lab 07/02/2019 2213  CKTOTAL 66   CBG: Recent Labs  Lab 06/16/19 2356 10-Jul-2019 0310 July 10, 2019 0338 Jul 10, 2019 0729 07/10/19 0748  GLUCAP 74 27* 116* 18* 124*   Iron Studies: No results for input(s): IRON, TIBC, TRANSFERRIN, FERRITIN in the last 72 hours. _0 @ Studies/Results: Dg Abd 1 View  Result Date: 06/16/2019 CLINICAL DATA:  Ongoing abdominal distention. EXAM: ABDOMEN - 1 VIEW COMPARISON:  June 13, 2019 FINDINGS: The NG tube terminates in the stomach. Mildly prominent air-filled colon is identified measuring up to 8 cm. No small bowel dilatation is noted. No other acute abnormalities identified. IMPRESSION: 1. The NG tube terminates in the stomach. 2. Prominent air-filled colon to the level the splenic flexure may represent ileus. No other abnormalities. Electronically Signed   By: Dorise Bullion III M.D   On: 06/16/2019 08:10   US Renal  Result Date: 06/15/2019 CLINICAL DATA:  Acute renal failure EXAM: RENAL / URINARY TRACT ULTRASOUND COMPLETE COMPARISON:  Abdominal CT 04/20/2018 FINDINGS: Right Kidney: Renal measurements: 11.8 x 4.5 x 4.3 cm = volume: 121 mL . Echogenicity within normal limits. No mass or hydronephrosis visualized.  Left Kidney: Renal measurements: 11.3 x 5.6 x 5.8 cm = volume: 190 mL. Echogenicity within normal limits. No mass or hydronephrosis visualized. Bladder: Completely decompressed by Foley catheter. Incidental echogenic liver. IMPRESSION: 1. Negative renal ultrasound. 2. Completely decompressed bladder by a Foley catheter. 3. Hepatic steatosis. Electronically Signed   By: Monte Fantasia M.D.   On: 06/15/2019 10:03   Dg Chest Port 1 View  Result Date: 07-10-19 CLINICAL DATA:  Acute respiratory failure EXAM: PORTABLE CHEST 1 VIEW COMPARISON:  06/16/2019 FINDINGS: Cardiac shadow is enlarged but stable. Endotracheal tube and gastric catheter are unchanged. Right jugular temporary dialysis catheter is seen. The overall inspiratory effort is poor. Mild vascular congestion is seen. IMPRESSION: Mild vascular congestion. Poor inspiratory effort. Tubes and lines as described. Electronically Signed   By: Inez Catalina M.D.   On: 07-10-19 07:04   Dg Chest Port 1 View  Result Date: 06/16/2019 CLINICAL DATA:  Follow-up ETT EXAM: PORTABLE CHEST 1 VIEW COMPARISON:  June 15, 2019 FINDINGS: The ETT terminates 11 mm above the carina. The right central line terminates in the central SVC. No pneumothorax. The NG tube terminates below today's film. The cardiomediastinal silhouette is stable. Mild atelectasis in the left base. No other acute abnormality. IMPRESSION: 1. The ETT terminates 11 mm above the carina. Recommend withdrawing 2 cm. Other support apparatus as above. 2. Probable mild atelectasis in the left base. 3. No other changes. Electronically Signed   By: Dorise Bullion III M.D   On: 06/16/2019 08:09   Dg Chest Port 1 View  Result Date: 06/15/2019 CLINICAL DATA:  Central line placement. Intubated patient. Follow-up exam. EXAM: PORTABLE CHEST 1 VIEW COMPARISON:  06/14/2019 and older studies. FINDINGS: New dual lumen right internal jugular central venous catheter has its tip projecting in the mid superior vena cava.  Endotracheal tube and nasal/orogastric tube are stable and well positioned. Hazy opacity noted in the left upper lobe lingula on the most recent prior exam has improved. There are now linear opacities at the left lung base and along the minor fissure on the right, most likely atelectasis. Findings are accentuated by low lung volumes. Remainder of the lungs is clear.  No convincing pleural effusion.  No pneumothorax. IMPRESSION: 1. New right internal jugular dual-lumen central venous catheter. Tip lies in the mid superior vena cava. No pneumothorax. 2. Improved lung aeration with decreased airspace in the left upper lobe lingula. No new lung abnormalities. Electronically Signed   By: Lajean Manes M.D.   On: 06/15/2019 15:17   Medications: . acetylcysteine 15 mg/kg/hr (2019-07-14 0751)  . amiodarone 30 mg/hr (07/14/2019 0700)  . angiotensin II (GIAPREZA) infusion 30 ng/kg/min (14-Jul-2019 0700)  . dexmedetomidine (PRECEDEX) IV infusion Stopped (07-14-2019 0501)  . levETIRAcetam Stopped (06/16/19 2238)  . norepinephrine (LEVOPHED) Adult infusion 40 mcg/min (07/14/2019 0700)  . piperacillin-tazobactam (ZOSYN)  IV 12.5 mL/hr at 07-14-2019 0700  . prismasol BGK 4/2.5 2,000 mL/hr at 06/16/19 1230  . sodium bicarbonate (isotonic) 1000 mL infusion 800 mL/hr at 07/14/2019 0733  . sodium bicarbonate (isotonic) 1000 mL infusion 400 mL/hr at 07-14-2019 0739  . vasopressin (PITRESSIN) infusion - *FOR SHOCK* Stopped (2019-07-14 0414)   . sodium bicarbonate      . chlorhexidine gluconate (MEDLINE KIT)  15 mL Mouth Rinse BID  . Chlorhexidine Gluconate Cloth  6 each Topical Daily  . feeding supplement (PRO-STAT SUGAR FREE 64)  30 mL Per Tube BID  . feeding supplement (VITAL HIGH PROTEIN)  1,000 mL Per Tube Q24H  . free water  200 mL Per Tube Q8H  . Gerhardt's butt cream   Topical QID  . heparin  5,000 Units Subcutaneous Q8H  . insulin aspart  0-15 Units Subcutaneous Q4H  . insulin glargine  20 Units Subcutaneous Daily  .  lactulose  20 g Oral TID  . mouth rinse  15 mL Mouth Rinse 10 times per day  . metoCLOPramide (REGLAN) injection  5 mg Intravenous Q8H  . pantoprazole sodium  40 mg Per Tube Q1200  . sennosides  10 mL Per Tube BID  . sodium bicarbonate  50 mEq Intravenous Once  . sodium chloride flush  10-40 mL Intracatheter Q12H    Assessment/Plan: **Acute liver injury: secondary to APAP toxicity, NAC per poison control.  Care per primary and GI.  Not currently liver transplant candidate.  INR stable 1.5.  AST improved from >10k to 2150 to 1536.     **AKI:  Presumably ATN in setting of shock.  Likely with mild underlying CKD due to DM. Presenting CK normal.  Renal US 7/13 normal.  UA from foley specimen + protein, blood, leukocytes - would repeat after foley removed, do not suspect GN.  In light of trajectory with worsening renal function, dec UOP, hypotension and worsening acidosis initiated CRRT 7/13.  Her CRRT is now fairly maximal support in terms of clearance and bicarb.   **Acidosis:  AGMA from lactic acidosis + renal failure.  Profoundly worsening overnight with lactate  > 11.  Rec'd several pushes of bicarb.  Pre and post filter fluids changed to bicarb gtt.  Last ABG with 7.16 / 35 / 64 - monitor with bicarb pushes PRN.   **Hypernatremia: Resolved. FWF 300 q4H.   **Acute hypoxic respiratory failure: CXR with LLL opacity c/w aspiration.  Currently on vent 60% FiO2.  CXR this AM with mild vascular congestion.  In the setting of severe shock I'm hesitant to initiate negative UF but if PCCM feels it would be helpful it can be attempted.   **DM complicated by HHS on presentation:  On insulin gtt with improved BG.  Per primary. A1c was 12.9 in 04/2018 at [facility], no others for review.   **  AMS: no acute issue on CT or EEG.  Encephalopathy likely multifactorial.  Per Primary.  **Seizure DO:  On keppra; dosing per Pharmacy with CRRT initiated.   **Dispo: very grim prognosis and I'd support transition to  comfort care at this point.    Jannifer Hick MD 2019-07-14, 7:52 AM  [facility] Pager: [phone]

## 2019-07-04 NOTE — Discharge Summary (Signed)
63 year old diabetic hypertensive with CKD stage III and seizure disorder who was admitted 7/10 with altered mental status found to have glucose more than 900 and Tylenol level of 44 with transaminitis, head CT negative Course complicated by acute liver failure and AKI, started on CRRT 7/13 for severe acidosis  Past Medical History  CKD III HTN HLD DM CVA Seizure Disorder COPD  Significant Hospital Events   7/10 intubated at [facility], CVC placed, [facility]  7/11 No overnight events No seizures noted overnight 7/13 Started CRRT  Consults:  Nephrology  Gastreoenterology  Procedures:  7/10 ETT>> 7/10 R Fem CVC>>  7/13 RIJ HD cath >> 7/13 R radial A line >>  Significant Diagnostic Tests:  7/10 CT Head non-con Oval Linsey) read: no acute intracranial abnormality 7/10 CXR [facility] Linsey) read: no cardiomegaly, mild bibasilar atelectasis, no pulmonary edema   Micro Data:  7/10 SARS CoV2> negative ([place] performed)  7/12 BC NGTD  Antimicrobials:  7/12 Zosyn >   EVENTS/ COURSE :  LFTs peaked at 10K and started trending down.  Acidosis and improved by 7/14-7.30 range on CRRT.  [name]  She developed a colonic ileus, fentanyl was discontinued and tube feeds stopped overnight she developed refractory acidosis and worsening abdominal distention and hypotension.  She was maxed out on Levophed, vasopressin was added and Giapreza was added  Abdominal exam and x-ray abdomen and persistent high lactate seem to suggest ischemic bowel.  Surgical opinion was obtained  Unfortunately she developed refractory shock, bradycardic episodes. Daughters were informed and limited CODE STATUS was issued prior to her passing away  Case was referred to medical examiner. Preliminary cause of death-acute liver failure, acute kidney injury, acetaminophen toxicity, ischemic  bowel   Kara Mead MD. FCCP. [facility] Pager [phone] If no response call [phone]   06/19/2019

## 2019-07-04 NOTE — Progress Notes (Signed)
Received report from day RN.  As shift progresses, temp decreases from 35.5c to 34.7c.  Warmer placed immediately but temp continued to decrease.  Patient became hypotensive.  Map=50.  Levo increased to max.  Vaso started.  crrt is keeping patient even.  abg shows severe acidosis.  Bicarb=5 then 8 then 12.  Patient not responding to albumin or bicarb injections anymore...many given.  Going to start giapreza in a few minutes.  MD attempting to contact any family.  sbp and temp continue to decrease at this time.  CDS notified as GCS=3

## 2019-07-04 NOTE — Progress Notes (Signed)
[place] Progress Note Patient Name: Eshal Propps DOB: [birth year] MRN: [id number]   Date of Service  25-Jun-2019  HPI/Events of Note  Profound metabolic acidosis  eICU Interventions  Sodium bicarbonate 100 meq iv bolus x 1,  RN instructed to contact on call nephrologist regarding adjustment of dialysate fluid to increase bicarbonate content.        Kerry Kass Ogan June 25, 2019, 1:35 AM

## 2019-07-04 NOTE — Progress Notes (Signed)
Patient ID: Melissa Foley, female   DOB: [birth year], 63 y.o.   MRN: [id number]       Subjective: Asked to see patient by CCM due to worsening abdominal distention.  This is a 63 yo patient who was found at home by her roommate in respiratory failure.  It was noted that she took too much tylenol (intentions are unclear).  She has ended up in liver failure along with multiple systems organ failure on the vent and on CRRT.  She has been here for several days and on multiple pressors.  Overnight she was noted to have worsening abdominal distention.  An initial film revealed colonic distention, likely ileus.  She has progressively worsened overall and with her distention.  A new film this morning was obtained which revealed worsening dilatation of the small bowel with diffuse pneumatosis seen on plain film.  We were asked to see for further thoughts.    Objective: Vital signs in last 24 hours: Temp:  [94.3 F (34.6 C)-98.8 F (37.1 C)] 94.3 F (34.6 C) (07/15 0800) Pulse Rate:  [42-197] 118 (07/15 0800) Resp:  [25-41] 25 (07/15 0800) BP: (62-122)/(36-103) 95/70 (07/15 0800) SpO2:  [90 %-99 %] 93 % (07/15 0800) Arterial Line BP: (59-141)/(32-53) 93/49 (07/15 0800) FiO2 (%):  [60 %-70 %] 70 % (07/15 0700) Weight:  [131 kg] 131 kg (07/15 0500) Last BM Date: 06/16/19(aprox 6AM)  Intake/Output from previous day: 07/14 0701 - 07/15 0700 In: 3787 [I.V.:2339.5; NG/GT:530; IV Piggyback:917.4] Out: 4235 [Urine:20; Emesis/NG output:600] Intake/Output this shift: Total I/O In: 347.5 [I.V.:322.8; IV Piggyback:24.7] Out: 328 [Other:328]  PE: Gen: critically ill, actively dying patient with diffuse mottling  Heart: brady, irregular, and then regular secondary to dose of epi just given Lungs: BS on vent Abd: morbidly obese, significant abdominal distention, mottled abdominal wall, no BS  Lab Results:  Recent Labs    06/16/19 0456  2019/06/22 0119 June 22, 2019 0922  WBC 14.7*  --  20.4*  --   HGB  10.0*   < > 9.3* 8.8*  HCT 31.3*   < > 33.0* 26.0*  PLT 199  --  141*  --    < > = values in this interval not displayed.   BMET Recent Labs    06/16/19 1604 06-22-19 0119 06-22-2019 0922  NA 140 144 137  K 4.8 4.7 4.3  CL 102 100  --   CO2 11* 9*  --   GLUCOSE 202* 38*  --   BUN 35* 26*  --   CREATININE 2.47* 2.26*  --   CALCIUM 8.4* 8.3*  --    PT/INR Recent Labs    06/16/19 0456 22-Jun-2019 0119  LABPROT 17.5* 17.4*  INR 1.5* 1.4*   CMP     Component Value Date/Time   NA 137 Jun 22, 2019 0922   K 4.3 June 22, 2019 0922   CL 100 June 22, 2019 0119   CO2 9 (L) 2019/06/22 0119   GLUCOSE 38 (LL) 22-Jun-2019 0119   BUN 26 (H) 06/22/19 0119   CREATININE 2.26 (H) 06/22/19 0119   CALCIUM 8.3 (L) 2019/06/22 0119   PROT 5.7 (L) June 22, 2019 0119   ALBUMIN 2.2 (L) 2019/06/22 0119   AST 1,536 (H) 06-22-19 0119   ALT 2,191 (H) 06/22/2019 0119   ALKPHOS 383 (H) Jun 22, 2019 0119   BILITOT 1.3 (H) Jun 22, 2019 0119   GFRNONAA 23 (L) 06-22-2019 0119   GFRAA 26 (L) 2019/06/22 0119   Lipase  No results found for: LIPASE     Studies/Results: Dg Abd  1 View  Result Date: 07-01-19 CLINICAL DATA:  Abdominal distension. EXAM: ABDOMEN - 1 VIEW COMPARISON:  06/16/2019 FINDINGS: Significant interval progression of small bowel dilatation with air throughout the small bowel. There also appears to be extensive pneumatosis intestinalis. There is some air in the ascending and transverse colon also. I do not see any obvious free air. IMPRESSION: Progressive small-bowel distension and some persistent ascending and transverse colon distension. Extensive pneumatosis intestinalis. This could be benign but could not exclude bowel ischemia. Recommend clinical correlation. CT may be helpful for further evaluation. No obvious free air. These results will be called to the ordering clinician or representative by the Radiologist Assistant, and communication documented in the PACS or zVision Dashboard.  Electronically Signed   By: Marijo Sanes M.D.   On: 2019-07-01 09:43   Dg Abd 1 View  Result Date: 06/16/2019 CLINICAL DATA:  Ongoing abdominal distention. EXAM: ABDOMEN - 1 VIEW COMPARISON:  June 13, 2019 FINDINGS: The NG tube terminates in the stomach. Mildly prominent air-filled colon is identified measuring up to 8 cm. No small bowel dilatation is noted. No other acute abnormalities identified. IMPRESSION: 1. The NG tube terminates in the stomach. 2. Prominent air-filled colon to the level the splenic flexure may represent ileus. No other abnormalities. Electronically Signed   By: Dorise Bullion III M.D   On: 06/16/2019 08:10   Dg Chest Port 1 View  Result Date: 2019-07-01 CLINICAL DATA:  Acute respiratory failure EXAM: PORTABLE CHEST 1 VIEW COMPARISON:  06/16/2019 FINDINGS: Cardiac shadow is enlarged but stable. Endotracheal tube and gastric catheter are unchanged. Right jugular temporary dialysis catheter is seen. The overall inspiratory effort is poor. Mild vascular congestion is seen. IMPRESSION: Mild vascular congestion. Poor inspiratory effort. Tubes and lines as described. Electronically Signed   By: Inez Catalina M.D.   On: July 01, 2019 07:04   Dg Chest Port 1 View  Result Date: 06/16/2019 CLINICAL DATA:  Follow-up ETT EXAM: PORTABLE CHEST 1 VIEW COMPARISON:  June 15, 2019 FINDINGS: The ETT terminates 11 mm above the carina. The right central line terminates in the central SVC. No pneumothorax. The NG tube terminates below today's film. The cardiomediastinal silhouette is stable. Mild atelectasis in the left base. No other acute abnormality. IMPRESSION: 1. The ETT terminates 11 mm above the carina. Recommend withdrawing 2 cm. Other support apparatus as above. 2. Probable mild atelectasis in the left base. 3. No other changes. Electronically Signed   By: Dorise Bullion III M.D   On: 06/16/2019 08:09   Dg Chest Port 1 View  Result Date: 06/15/2019 CLINICAL DATA:  Central line placement.  Intubated patient. Follow-up exam. EXAM: PORTABLE CHEST 1 VIEW COMPARISON:  06/14/2019 and older studies. FINDINGS: New dual lumen right internal jugular central venous catheter has its tip projecting in the mid superior vena cava. Endotracheal tube and nasal/orogastric tube are stable and well positioned. Hazy opacity noted in the left upper lobe lingula on the most recent prior exam has improved. There are now linear opacities at the left lung base and along the minor fissure on the right, most likely atelectasis. Findings are accentuated by low lung volumes. Remainder of the lungs is clear. No convincing pleural effusion.  No pneumothorax. IMPRESSION: 1. New right internal jugular dual-lumen central venous catheter. Tip lies in the mid superior vena cava. No pneumothorax. 2. Improved lung aeration with decreased airspace in the left upper lobe lingula. No new lung abnormalities. Electronically Signed   By: Dedra Skeens.D.  On: 06/15/2019 15:17    Anti-infectives: Anti-infectives (From admission, onward)   Start     Dose/Rate Route Frequency Ordered Stop   06/15/19 2300  piperacillin-tazobactam (ZOSYN) IVPB 3.375 g     3.375 g 12.5 mL/hr over 240 Minutes Intravenous Every 6 hours 06/15/19 1441     06/15/19 0700  piperacillin-tazobactam (ZOSYN) IVPB 3.375 g  Status:  Discontinued     3.375 g 12.5 mL/hr over 240 Minutes Intravenous Every 8 hours 06/14/19 2242 06/14/19 2250   06/15/19 0500  piperacillin-tazobactam (ZOSYN) IVPB 2.25 g     2.25 g 100 mL/hr over 30 Minutes Intravenous Every 6 hours 06/14/19 2249 06/15/19 1744   06/14/19 2300  piperacillin-tazobactam (ZOSYN) IVPB 3.375 g     3.375 g 100 mL/hr over 30 Minutes Intravenous  Once 06/14/19 2242 06/15/19 0127       Assessment/Plan MSOF  Ischemic bowel  The patient is critically ill and likely has diffuse ischemic bowel based off of plain film this morning.  She is unstable and on my way to see the patient became severely brady and  hypotensive.  A dose of epi was given to try and keep the patient alive so her family could make it here before she passes.  Clearly, the patient is actively dying and there is no role for surgical intervention as she would not survive with or without and operation.  Discussed with Dr. Elsworth Soho.    LOS: 5 days    Henreitta Cea , [facility] 07-06-19, 10:05 AM Pager: [phone]

## 2019-07-04 NOTE — Progress Notes (Signed)
Pt expired at 1059.  Dr Elsworth Soho notified.  This Probation officer and Thersa Salt RN listended for heart beat for 1 full minute with no sounds heard. Medical examiner stated this is an ME case so pt will go down to morgue with all lines, tubes, and ETT intact.   Pt daughter Melissa Foley arrived shortly after pt expired.  Caryl Pina and Husband spent time in room with pt.  [facility] notified of patients death.  Irven Baltimore, RN

## 2019-07-04 NOTE — Consult Note (Signed)
        Daily Rounding Note  06/26/2019, 8:57 AM  LOS: 5 days   SUBJECTIVE:   Chief complaint: acute liver injury    Hypotension, acidotic (lactate 2.9 to >11 in 48 hours), hypothermic overnight.  On bear hugger.  Multiple pressors Xray in progress for r/o ileus as abdomen markedly distended.   Remains on CRRT.  Renal notes "grim prognosis"  Current BP is 40/24.  HR in 50s.    OBJECTIVE:         Vital signs in last 24 hours:    Temp:  [94.3 F (34.6 C)-98.8 F (37.1 C)] 94.3 F (34.6 C) (07/15 0800) Pulse Rate:  [42-197] 118 (07/15 0800) Resp:  [25-41] 25 (07/15 0800) BP: (62-122)/(36-103) 95/70 (07/15 0800) SpO2:  [90 %-99 %] 93 % (07/15 0800) Arterial Line BP: (59-141)/(32-53) 93/49 (07/15 0800) FiO2 (%):  [60 %-70 %] 70 % (07/15 0700) Weight:  [131 kg] 131 kg (07/15 0500) Last BM Date: 06/16/19(aprox 6AM) Filed Weights   06/15/19 0500 06/16/19 0500 06/15/2019 0500  Weight: 129.7 kg 132 kg 131 kg   General: ill, multiple lines c/w aggressive CCM support.  unresponsive   Heart: RRR Chest: on vent Abdomen: very tight and distended.  Quiet. Mottled skin   Extremities: + edema Neuro/Psych:  Sedated, unresponsive.    Intake/Output from previous day: 07/14 0701 - 07/15 0700 In: 3787 [I.V.:2339.5; NG/GT:530; IV Piggyback:917.4] Out: 4235 [Urine:20; Emesis/NG output:600]  Intake/Output this shift: Total I/O In: 171.9 [I.V.:159.8; IV Piggyback:12.1] Out: 173 [Other:173]  Lab Results: Recent Labs    06/14/19 2253  06/16/19 0456 06/16/19 0500 06/16/2019 0119  WBC 7.1  --  14.7*  --  20.4*  HGB 10.3*   < > 10.0* 9.5* 9.3*  HCT 32.3*   < > 31.3* 28.0* 33.0*  PLT 226  --  199  --  141*   < > = values in this interval not displayed.   BMET Recent Labs    06/16/19 0456 06/16/19 0500 06/16/19 1604 06/15/2019 0119  NA 140 138 140 144  K 4.4 4.3 4.8 4.7  CL 101  --  102 100  CO2 11*  --  11* 9*  GLUCOSE 214*  --   202* 38*  BUN 47*  --  35* 26*  CREATININE 3.28*  --  2.47* 2.26*  CALCIUM 8.3*  --  8.4* 8.3*   LFT Recent Labs    06/15/19 0509  06/16/19 0456 06/16/19 1604 06/10/2019 0119  PROT 4.9*  --  5.2*  --  5.7*  ALBUMIN 1.7*   < > 1.5* 1.4* 2.2*  AST >10,000*  --  2,150*  --  1,536*  ALT 4,740*  --  2,828*  --  2,191*  ALKPHOS 154*  --  189*  --  383*  BILITOT 0.9  --  1.8*  --  1.3*  BILIDIR 0.3*  --   --   --   --   IBILI 0.6  --   --   --   --    < > = values in this interval not displayed.   PT/INR Recent Labs    06/16/19 0456 06/07/2019 0119  LABPROT 17.5* 17.4*  INR 1.5* 1.4*   Hepatitis Panel Recent Labs    06/16/19 0456  HEPBSAG Negative  HCVAB <0.1  HEPAIGM Negative  HEPBIGM Negative    Studies/Results: Dg Abd 1 View  Result Date: 06/16/2019 CLINICAL DATA:  Ongoing abdominal distention. EXAM: ABDOMEN - 1 VIEW   COMPARISON:  June 13, 2019 FINDINGS: The NG tube terminates in the stomach. Mildly prominent air-filled colon is identified measuring up to 8 cm. No small bowel dilatation is noted. No other acute abnormalities identified. IMPRESSION: 1. The NG tube terminates in the stomach. 2. Prominent air-filled colon to the level the splenic flexure may represent ileus. No other abnormalities. Electronically Signed   By: David  Williams III M.D   On: 06/16/2019 08:10   Us Renal  Result Date: 06/15/2019 CLINICAL DATA:  Acute renal failure EXAM: RENAL / URINARY TRACT ULTRASOUND COMPLETE COMPARISON:  Abdominal CT 04/20/2018 FINDINGS: Right Kidney: Renal measurements: 11.8 x 4.5 x 4.3 cm = volume: 121 mL . Echogenicity within normal limits. No mass or hydronephrosis visualized. Left Kidney: Renal measurements: 11.3 x 5.6 x 5.8 cm = volume: 190 mL. Echogenicity within normal limits. No mass or hydronephrosis visualized. Bladder: Completely decompressed by Foley catheter. Incidental echogenic liver. IMPRESSION: 1. Negative renal ultrasound. 2. Completely decompressed bladder by a  Foley catheter. 3. Hepatic steatosis. Electronically Signed   By: Jonathon  Watts M.D.   On: 06/15/2019 10:03   Dg Chest Port 1 View  Result Date: 07/01/2019 CLINICAL DATA:  Acute respiratory failure EXAM: PORTABLE CHEST 1 VIEW COMPARISON:  06/16/2019 FINDINGS: Cardiac shadow is enlarged but stable. Endotracheal tube and gastric catheter are unchanged. Right jugular temporary dialysis catheter is seen. The overall inspiratory effort is poor. Mild vascular congestion is seen. IMPRESSION: Mild vascular congestion. Poor inspiratory effort. Tubes and lines as described. Electronically Signed   By: Mark  Lukens M.D.   On: 06/30/2019 07:04   Dg Chest Port 1 View  Result Date: 06/16/2019 CLINICAL DATA:  Follow-up ETT EXAM: PORTABLE CHEST 1 VIEW COMPARISON:  June 15, 2019 FINDINGS: The ETT terminates 11 mm above the carina. The right central line terminates in the central SVC. No pneumothorax. The NG tube terminates below today's film. The cardiomediastinal silhouette is stable. Mild atelectasis in the left base. No other acute abnormality. IMPRESSION: 1. The ETT terminates 11 mm above the carina. Recommend withdrawing 2 cm. Other support apparatus as above. 2. Probable mild atelectasis in the left base. 3. No other changes. Electronically Signed   By: David  Williams III M.D   On: 06/16/2019 08:09   Dg Chest Port 1 View  Result Date: 06/15/2019 CLINICAL DATA:  Central line placement. Intubated patient. Follow-up exam. EXAM: PORTABLE CHEST 1 VIEW COMPARISON:  06/14/2019 and older studies. FINDINGS: New dual lumen right internal jugular central venous catheter has its tip projecting in the mid superior vena cava. Endotracheal tube and nasal/orogastric tube are stable and well positioned. Hazy opacity noted in the left upper lobe lingula on the most recent prior exam has improved. There are now linear opacities at the left lung base and along the minor fissure on the right, most likely atelectasis. Findings are  accentuated by low lung volumes. Remainder of the lungs is clear. No convincing pleural effusion.  No pneumothorax. IMPRESSION: 1. New right internal jugular dual-lumen central venous catheter. Tip lies in the mid superior vena cava. No pneumothorax. 2. Improved lung aeration with decreased airspace in the left upper lobe lingula. No new lung abnormalities. Electronically Signed   By: David  Ormond M.D.   On: 06/15/2019 15:17    Scheduled Meds: . chlorhexidine gluconate (MEDLINE KIT)  15 mL Mouth Rinse BID  . Chlorhexidine Gluconate Cloth  6 each Topical Daily  . Gerhardt's butt cream   Topical QID  . heparin  5,000 Units Subcutaneous   Q8H  . insulin aspart  0-9 Units Subcutaneous Q4H  . lactulose  20 g Oral TID  . mouth rinse  15 mL Mouth Rinse 10 times per day  . metoCLOPramide (REGLAN) injection  5 mg Intravenous Q8H  . pantoprazole sodium  40 mg Per Tube Q1200  . sennosides  10 mL Per Tube BID  . sodium bicarbonate      . sodium chloride flush  10-40 mL Intracatheter Q12H   Continuous Infusions: . acetylcysteine 15 mg/kg/hr (06/23/2019 0800)  . amiodarone 30 mg/hr (06/22/2019 0800)  . angiotensin II (GIAPREZA) infusion 40 ng/kg/min (06/29/2019 0800)  . dextrose    . levETIRAcetam Stopped (06/16/19 2238)  . norepinephrine (LEVOPHED) Adult infusion 40 mcg/min (06/19/2019 0800)  . piperacillin-tazobactam (ZOSYN)  IV 12.5 mL/hr at 06/06/2019 0800  . prismasol BGK 4/2.5 2,000 mL/hr at 06/16/19 1230  . sodium bicarbonate (isotonic) 1000 mL infusion 800 mL/hr at 06/12/2019 0851  . sodium bicarbonate (isotonic) 1000 mL infusion 400 mL/hr at 07/02/2019 0739  . vasopressin (PITRESSIN) infusion - *FOR SHOCK* Stopped (06/05/2019 0414)   PRN Meds:.albuterol, bisacodyl, fentaNYL (SUBLIMAZE) injection, fentaNYL (SUBLIMAZE) injection, heparin, metoprolol tartrate, ondansetron (ZOFRAN) IV, sodium chloride flush   ASSESMENT:   *    Acute liver injury due to APAP.   Acetylcysteine day  Slight improvement INR.  T  Bili and transaminases better, worsening alk phos.  APA level <10 x 2 as of last assay 7/13.    *   Abdominal distension, r/o ileus  *   AKI.  Oliguric.  CRRT since 06/14/29  *   resp failure, likely aspiration.  Remains on vent.   *   Normocytic anemia.      *  Uncontrolled DM.  HHS at presentation.    *   AMS.  Multifactorial.     PLAN   *   At this point, GI signing off.  Call us if needed but outlook grim at present.         06/30/2019, 8:57 AM Phone [phone] 

## 2019-07-04 DEATH — deceased

## 2021-02-24 IMAGING — DX PORTABLE CHEST - 1 VIEW
1 series · 1 of 1 positions shown · non-contrast
Comparison: 06/14/2019 and older studies.

CLINICAL DATA: Central line placement. Intubated patient. Follow-up
exam.

EXAM:
PORTABLE CHEST 1 VIEW

[chest ap]
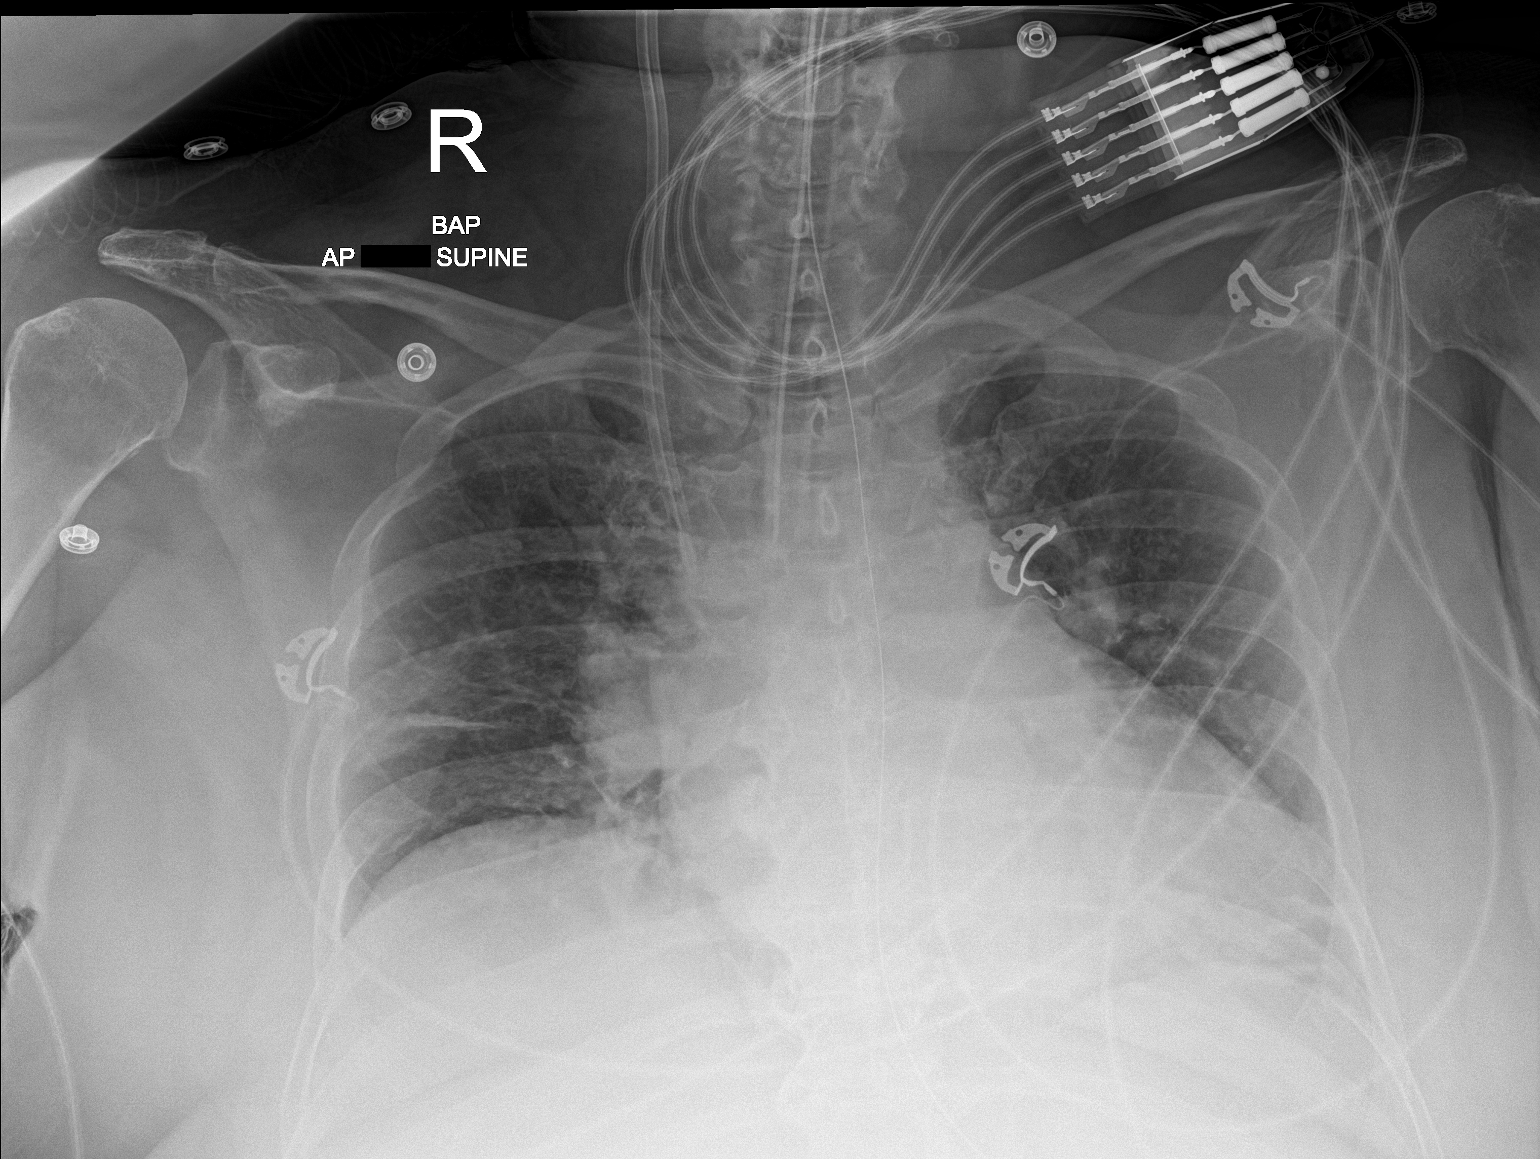

[1 of 1 positions shown; findings below may reference images not displayed]

FINDINGS: New dual lumen right internal jugular central venous catheter has
its tip projecting in the mid superior vena cava.

Endotracheal tube and nasal/orogastric tube are stable and well
positioned.

Hazy opacity noted in the left upper lobe lingula on the most recent
prior exam has improved. There are now linear opacities at the left
lung base and along the minor fissure on the right, most likely
atelectasis. Findings are accentuated by low lung volumes. Remainder
of the lungs is clear.

No convincing pleural effusion.  No pneumothorax.
IMPRESSION: 1. New right internal jugular dual-lumen central venous catheter.
Tip lies in the mid superior vena cava. No pneumothorax.
2. Improved lung aeration with decreased airspace in the left upper
lobe lingula. No new lung abnormalities.

## 2021-02-26 IMAGING — DX ABDOMEN - 1 VIEW
2 series · 2 of 2 positions shown · non-contrast
Comparison: 06/16/2019

CLINICAL DATA: Abdominal distension.

EXAM:
ABDOMEN - 1 VIEW

[abdomen kub (1 of 2)]
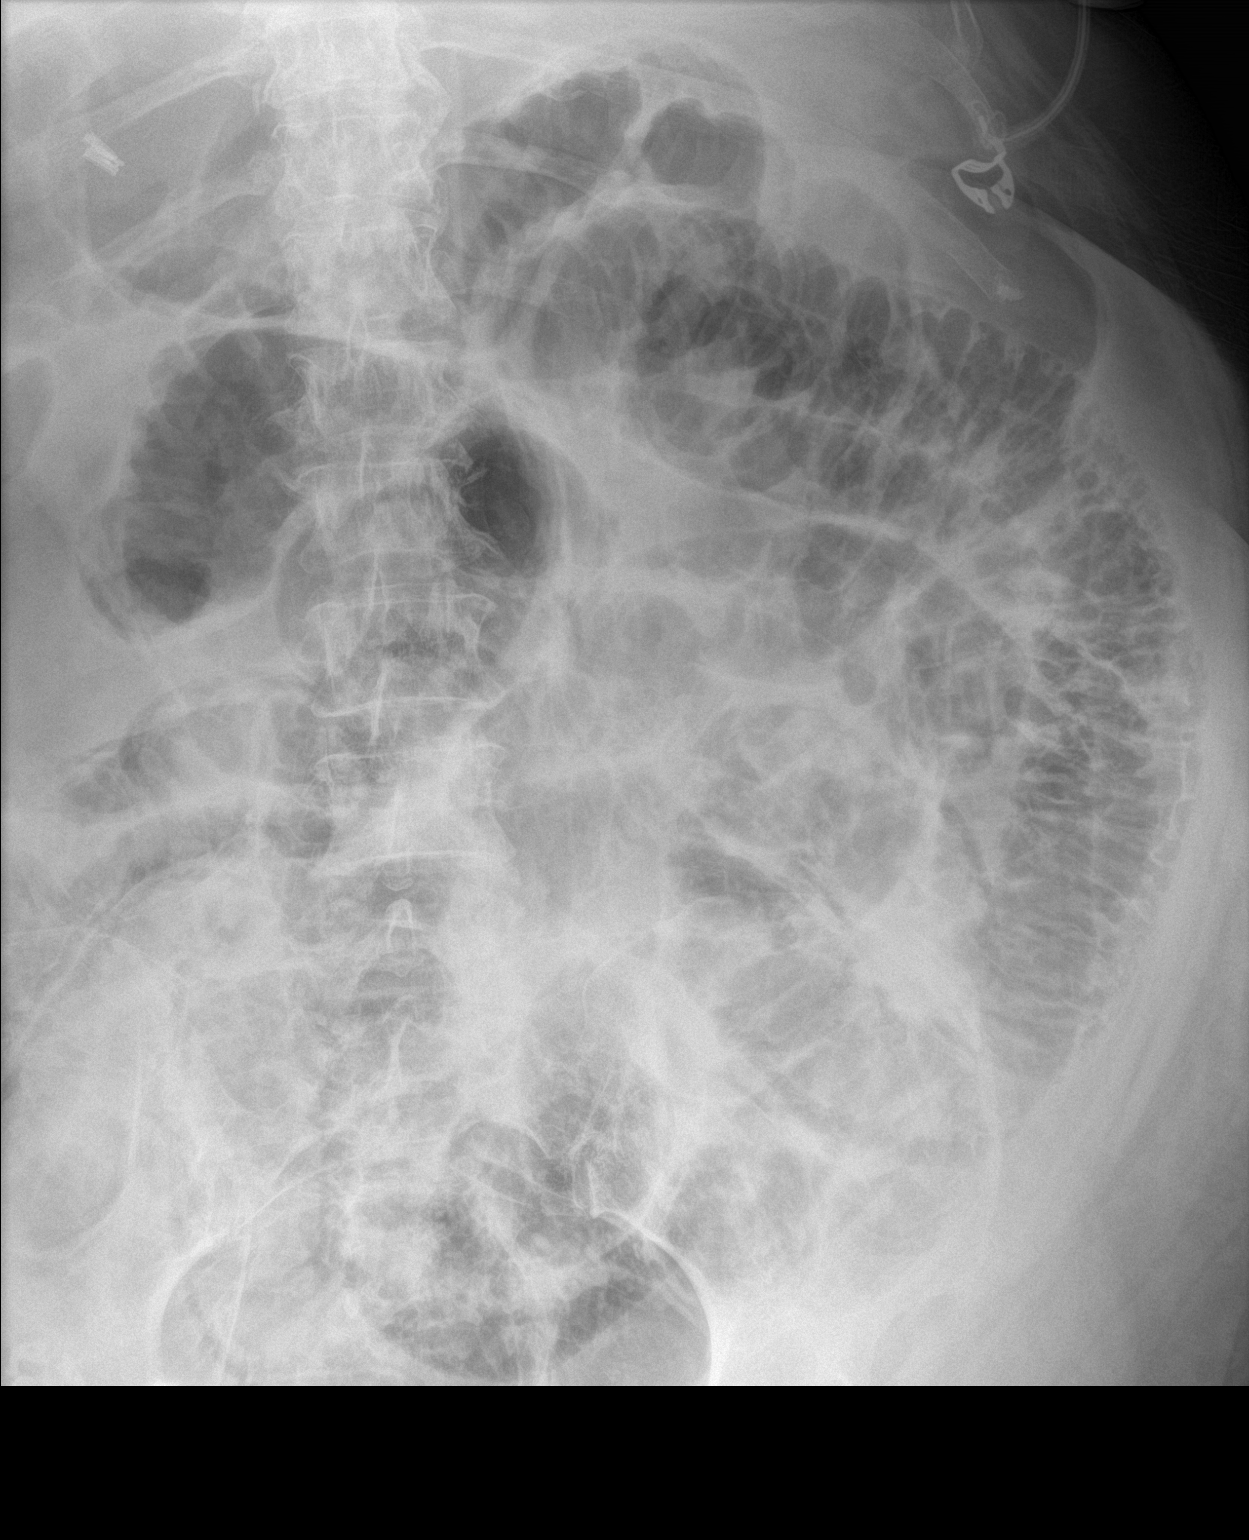

[abdomen kub (2 of 2)]
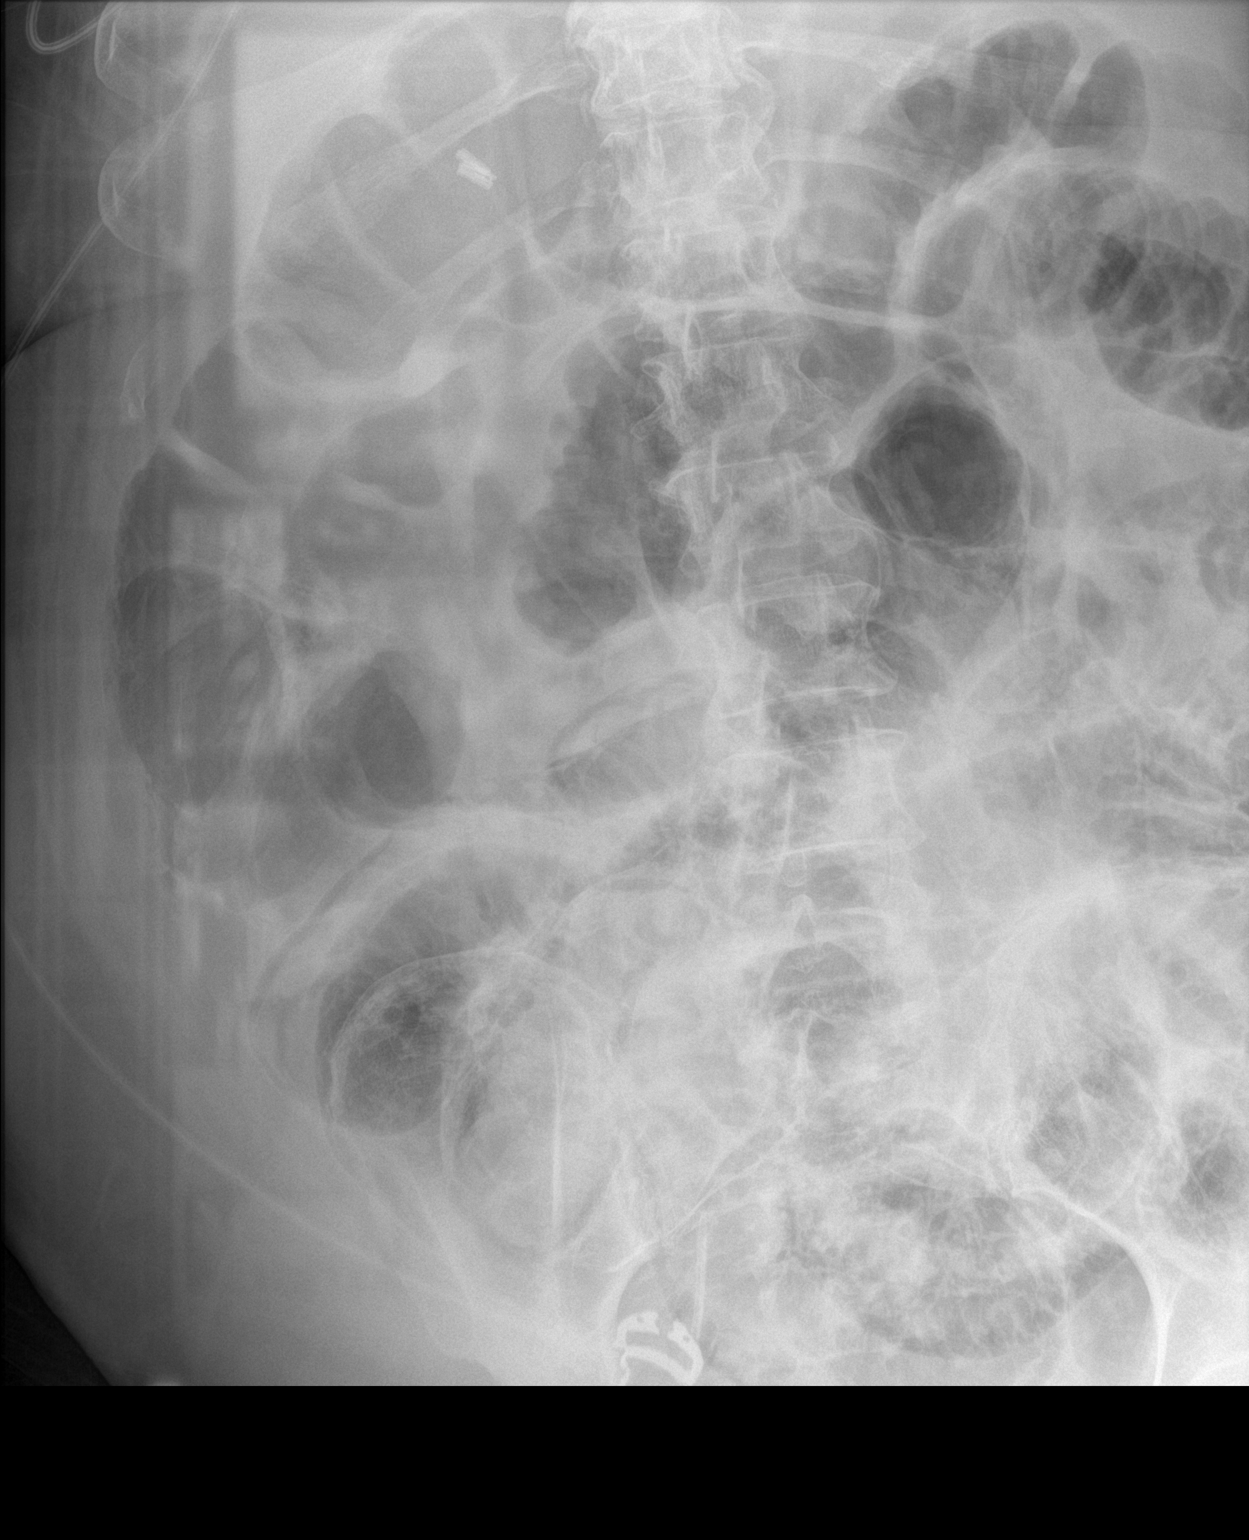

[2 of 2 positions shown; findings below may reference images not displayed]

FINDINGS: Significant interval progression of small bowel dilatation with air
throughout the small bowel. There also appears to be extensive
pneumatosis intestinalis. There is some air in the ascending and
transverse colon also. I do not see any obvious free air.
IMPRESSION: Progressive small-bowel distension and some persistent ascending and
transverse colon distension.

Extensive pneumatosis intestinalis. This could be benign but could
not exclude bowel ischemia. Recommend clinical correlation. CT may
be helpful for further evaluation.

No obvious free air.

These results will be called to the ordering clinician or
representative by the Radiologist Assistant, and communication
documented in the PACS or zVision Dashboard.
